# Patient Record
Sex: Female | Born: 1955 | Race: White | Hispanic: No | Marital: Married | Smoking: Never smoker
Health system: Southern US, Community
[De-identification: ages and names within clinical notes are randomized; demographics above are authoritative.]

## PROBLEM LIST (undated history)

## (undated) DIAGNOSIS — R7401 Elevation of levels of liver transaminase levels: Secondary | ICD-10-CM

## (undated) DIAGNOSIS — E119 Type 2 diabetes mellitus without complications: Secondary | ICD-10-CM

## (undated) DIAGNOSIS — R740 Nonspecific elevation of levels of transaminase and lactic acid dehydrogenase [LDH]: Secondary | ICD-10-CM

## (undated) DIAGNOSIS — E785 Hyperlipidemia, unspecified: Secondary | ICD-10-CM

## (undated) DIAGNOSIS — I10 Essential (primary) hypertension: Secondary | ICD-10-CM

## (undated) DIAGNOSIS — R7303 Prediabetes: Secondary | ICD-10-CM

## (undated) HISTORY — DX: Type 2 diabetes mellitus without complications: E11.9

## (undated) HISTORY — DX: Elevation of levels of liver transaminase levels: R74.01

## (undated) HISTORY — DX: Nonspecific elevation of levels of transaminase and lactic acid dehydrogenase (ldh): R74.0

## (undated) HISTORY — DX: Essential (primary) hypertension: I10

## (undated) HISTORY — DX: Prediabetes: R73.03

## (undated) HISTORY — PX: INDUCED ABORTION: SHX677

## (undated) HISTORY — PX: PARTIAL HYSTERECTOMY: SHX80

## (undated) HISTORY — DX: Hyperlipidemia, unspecified: E78.5

## (undated) HISTORY — PX: TUBAL LIGATION: SHX77

---

## 1997-07-18 ENCOUNTER — Ambulatory Visit (HOSPITAL_COMMUNITY): Admission: RE | Admit: 1997-07-18 | Discharge: 1997-07-18 | Payer: Self-pay | Admitting: Obstetrics & Gynecology

## 1998-04-30 ENCOUNTER — Inpatient Hospital Stay (HOSPITAL_COMMUNITY): Admission: RE | Admit: 1998-04-30 | Discharge: 1998-05-02 | Payer: Self-pay | Admitting: Obstetrics & Gynecology

## 2016-10-21 ENCOUNTER — Encounter: Payer: Self-pay | Admitting: Family Medicine

## 2016-10-21 ENCOUNTER — Ambulatory Visit (INDEPENDENT_AMBULATORY_CARE_PROVIDER_SITE_OTHER): Payer: Managed Care, Other (non HMO) | Admitting: Family Medicine

## 2016-10-21 VITALS — BP 120/80 | HR 55 | Ht 66.0 in | Wt 200.2 lb

## 2016-10-21 DIAGNOSIS — Z23 Encounter for immunization: Secondary | ICD-10-CM

## 2016-10-21 DIAGNOSIS — I10 Essential (primary) hypertension: Secondary | ICD-10-CM | POA: Diagnosis not present

## 2016-10-21 DIAGNOSIS — Z1159 Encounter for screening for other viral diseases: Secondary | ICD-10-CM | POA: Diagnosis not present

## 2016-10-21 DIAGNOSIS — Z0000 Encounter for general adult medical examination without abnormal findings: Secondary | ICD-10-CM

## 2016-10-21 DIAGNOSIS — Z114 Encounter for screening for human immunodeficiency virus [HIV]: Secondary | ICD-10-CM

## 2016-10-21 DIAGNOSIS — E785 Hyperlipidemia, unspecified: Secondary | ICD-10-CM | POA: Insufficient documentation

## 2016-10-21 DIAGNOSIS — Z1211 Encounter for screening for malignant neoplasm of colon: Secondary | ICD-10-CM

## 2016-10-21 DIAGNOSIS — E669 Obesity, unspecified: Secondary | ICD-10-CM

## 2016-10-21 LAB — COMPREHENSIVE METABOLIC PANEL
ALT: 20 U/L (ref 6–29)
AST: 17 U/L (ref 10–35)
Albumin: 4.3 g/dL (ref 3.6–5.1)
Alkaline Phosphatase: 42 U/L (ref 33–130)
BUN: 13 mg/dL (ref 7–25)
CO2: 26 mmol/L (ref 20–31)
Calcium: 9.4 mg/dL (ref 8.6–10.4)
Chloride: 105 mmol/L (ref 98–110)
Creat: 0.92 mg/dL (ref 0.50–1.05)
Glucose, Bld: 94 mg/dL (ref 65–99)
Potassium: 3.7 mmol/L (ref 3.5–5.3)
Sodium: 140 mmol/L (ref 135–146)
Total Bilirubin: 0.4 mg/dL (ref 0.2–1.2)
Total Protein: 7.2 g/dL (ref 6.1–8.1)

## 2016-10-21 LAB — CBC WITH DIFFERENTIAL/PLATELET
Basophils Absolute: 57 cells/uL (ref 0–200)
Basophils Relative: 1 %
EOS ABS: 228 {cells}/uL (ref 15–500)
Eosinophils Relative: 4 %
HEMATOCRIT: 40.8 % (ref 35.0–45.0)
HEMOGLOBIN: 13.4 g/dL (ref 11.7–15.5)
LYMPHS ABS: 2622 {cells}/uL (ref 850–3900)
Lymphocytes Relative: 46 %
MCH: 29.7 pg (ref 27.0–33.0)
MCHC: 32.8 g/dL (ref 32.0–36.0)
MCV: 90.5 fL (ref 80.0–100.0)
MONO ABS: 513 {cells}/uL (ref 200–950)
MPV: 9.3 fL (ref 7.5–12.5)
Monocytes Relative: 9 %
NEUTROS ABS: 2280 {cells}/uL (ref 1500–7800)
Neutrophils Relative %: 40 %
Platelets: 254 10*3/uL (ref 140–400)
RBC: 4.51 MIL/uL (ref 3.80–5.10)
RDW: 13.1 % (ref 11.0–15.0)
WBC: 5.7 10*3/uL (ref 4.0–10.5)

## 2016-10-21 LAB — POCT URINALYSIS DIPSTICK
Bilirubin, UA: NEGATIVE
Blood, UA: NEGATIVE
Glucose, UA: NEGATIVE
Ketones, UA: NEGATIVE
Leukocytes, UA: NEGATIVE
Nitrite, UA: NEGATIVE
Protein, UA: NEGATIVE
Spec Grav, UA: 1.015 (ref 1.010–1.025)
Urobilinogen, UA: NEGATIVE E.U./dL — AB
pH, UA: 6 (ref 5.0–8.0)

## 2016-10-21 LAB — LIPID PANEL
Cholesterol: 190 mg/dL (ref <200)
HDL: 63 mg/dL (ref >50)
LDL CALC: 112 mg/dL — AB (ref <100)
TRIGLYCERIDES: 75 mg/dL (ref <150)
Total CHOL/HDL Ratio: 3 Ratio (ref <5.0)
VLDL: 15 mg/dL (ref <30)

## 2016-10-21 LAB — TSH: TSH: 3.03 m[IU]/L

## 2016-10-21 NOTE — Patient Instructions (Signed)
You will be due for your mammogram in September. You can call me and I will order this.  We will call you with lab results.   Preventative Care for Adults - Female      MAINTAIN REGULAR HEALTH EXAMS:  A routine yearly physical is a good way to check in with your primary care provider about your health and preventive screening. It is also an opportunity to share updates about your health and any concerns you have, and receive a thorough all-over exam.   Most health insurance companies pay for at least some preventative services.  Check with your health plan for specific coverages.  WHAT PREVENTATIVE SERVICES DO WOMEN NEED?  Adult women should have their weight and blood pressure checked regularly.   Women age 54 and older should have their cholesterol levels checked regularly.  Women should be screened for cervical cancer with a Pap smear and pelvic exam beginning at either age 52, or 3 years after they become sexually activity.    Breast cancer screening generally begins at age 6 with a mammogram and breast exam by your primary care provider.    Beginning at age 22 and continuing to age 81, women should be screened for colorectal cancer.  Certain people may need continued testing until age 84.  Updating vaccinations is part of preventative care.  Vaccinations help protect against diseases such as the flu.  Osteoporosis is a disease in which the bones lose minerals and strength as we age. Women ages 85 and over should discuss this with their caregivers, as should women after menopause who have other risk factors.  Lab tests are generally done as part of preventative care to screen for anemia and blood disorders, to screen for problems with the kidneys and liver, to screen for bladder problems, to check blood sugar, and to check your cholesterol level.  Preventative services generally include counseling about diet, exercise, avoiding tobacco, drugs, excessive alcohol consumption, and  sexually transmitted infections.    GENERAL RECOMMENDATIONS FOR GOOD HEALTH:  Healthy diet:  Eat a variety of foods, including fruit, vegetables, animal or vegetable protein, such as meat, fish, chicken, and eggs, or beans, lentils, tofu, and grains, such as rice.  Drink plenty of water daily.  Decrease saturated fat in the diet, avoid lots of red meat, processed foods, sweets, fast foods, and fried foods.  Exercise:  Aerobic exercise helps maintain good heart health. At least 30-40 minutes of moderate-intensity exercise is recommended. For example, a brisk walk that increases your heart rate and breathing. This should be done on most days of the week.   Find a type of exercise or a variety of exercises that you enjoy so that it becomes a part of your daily life.  Examples are running, walking, swimming, water aerobics, and biking.  For motivation and support, explore group exercise such as aerobic class, spin class, Zumba, Yoga,or  martial arts, etc.    Set exercise goals for yourself, such as a certain weight goal, walk or run in a race such as a 5k walk/run.  Speak to your primary care provider about exercise goals.  Disease prevention:  If you smoke or chew tobacco, find out from your caregiver how to quit. It can literally save your life, no matter how long you have been a tobacco user. If you do not use tobacco, never begin.   Maintain a healthy diet and normal weight. Increased weight leads to problems with blood pressure and diabetes.   The  Body Mass Index or BMI is a way of measuring how much of your body is fat. Having a BMI above 27 increases the risk of heart disease, diabetes, hypertension, stroke and other problems related to obesity. Your caregiver can help determine your BMI and based on it develop an exercise and dietary program to help you achieve or maintain this important measurement at a healthful level.  High blood pressure causes heart and blood vessel problems.   Persistent high blood pressure should be treated with medicine if weight loss and exercise do not work.   Fat and cholesterol leaves deposits in your arteries that can block them. This causes heart disease and vessel disease elsewhere in your body.  If your cholesterol is found to be high, or if you have heart disease or certain other medical conditions, then you may need to have your cholesterol monitored frequently and be treated with medication.   Ask if you should have a cardiac stress test if your history suggests this. A stress test is a test done on a treadmill that looks for heart disease. This test can find disease prior to there being a problem.  Menopause can be associated with physical symptoms and risks. Hormone replacement therapy is available to decrease these. You should talk to your caregiver about whether starting or continuing to take hormones is right for you.   Osteoporosis is a disease in which the bones lose minerals and strength as we age. This can result in serious bone fractures. Risk of osteoporosis can be identified using a bone density scan. Women ages 59 and over should discuss this with their caregivers, as should women after menopause who have other risk factors. Ask your caregiver whether you should be taking a calcium supplement and Vitamin D, to reduce the rate of osteoporosis.   Avoid drinking alcohol in excess (more than two drinks per day).  Avoid use of street drugs. Do not share needles with anyone. Ask for professional help if you need assistance or instructions on stopping the use of alcohol, cigarettes, and/or drugs.  Brush your teeth twice a day with fluoride toothpaste, and floss once a day. Good oral hygiene prevents tooth decay and gum disease. The problems can be painful, unattractive, and can cause other health problems. Visit your dentist for a routine oral and dental check up and preventive care every 6-12 months.   Look at your skin regularly.  Use a  mirror to look at your back. Notify your caregivers of changes in moles, especially if there are changes in shapes, colors, a size larger than a pencil eraser, an irregular border, or development of new moles.  Safety:  Use seatbelts 100% of the time, whether driving or as a passenger.  Use safety devices such as hearing protection if you work in environments with loud noise or significant background noise.  Use safety glasses when doing any work that could send debris in to the eyes.  Use a helmet if you ride a bike or motorcycle.  Use appropriate safety gear for contact sports.  Talk to your caregiver about gun safety.  Use sunscreen with a SPF (or skin protection factor) of 15 or greater.  Lighter skinned people are at a greater risk of skin cancer. Don't forget to also wear sunglasses in order to protect your eyes from too much damaging sunlight. Damaging sunlight can accelerate cataract formation.   Practice safe sex. Use condoms. Condoms are used for birth control and to help reduce the spread  of sexually transmitted infections (or STIs).  Some of the STIs are gonorrhea (the clap), chlamydia, syphilis, trichomonas, herpes, HPV (human papilloma virus) and HIV (human immunodeficiency virus) which causes AIDS. The herpes, HIV and HPV are viral illnesses that have no cure. These can result in disability, cancer and death.   Keep carbon monoxide and smoke detectors in your home functioning at all times. Change the batteries every 6 months or use a model that plugs into the wall.   Vaccinations:  Stay up to date with your tetanus shots and other required immunizations. You should have a booster for tetanus every 10 years. Be sure to get your flu shot every year, since 5%-20% of the [place] population comes down with the flu. The flu vaccine changes each year, so being vaccinated once is not enough. Get your shot in the fall, before the flu season peaks.   Other vaccines to consider:  Human Papilloma  Virus or HPV causes cancer of the cervix, and other infections that can be transmitted from person to person. There is a vaccine for HPV, and females should get immunized between the ages of 3 and 28. It requires a series of 3 shots.   Pneumococcal vaccine to protect against certain types of pneumonia.  This is normally recommended for adults age 78 or older.  However, adults younger than 60 years old with certain underlying conditions such as diabetes, heart or lung disease should also receive the vaccine.  Shingles vaccine to protect against Varicella Zoster if you are older than age 73, or younger than 60 years old with certain underlying illness.  Hepatitis A vaccine to protect against a form of infection of the liver by a virus acquired from food.  Hepatitis B vaccine to protect against a form of infection of the liver by a virus acquired from blood or body fluids, particularly if you work in health care.  If you plan to travel internationally, check with your local health department for specific vaccination recommendations.  Cancer Screening:  Breast cancer screening is essential to preventive care for women. All women age 48 and older should perform a breast self-exam every month. At age 20 and older, women should have their caregiver complete a breast exam each year. Women at ages 69 and older should have a mammogram (x-ray film) of the breasts. Your caregiver can discuss how often you need mammograms.    Cervical cancer screening includes taking a Pap smear (sample of cells examined under a microscope) from the cervix (end of the uterus). It also includes testing for HPV (Human Papilloma Virus, which can cause cervical cancer). Screening and a pelvic exam should begin at age 73, or 3 years after a woman becomes sexually active. Screening should occur every year, with a Pap smear but no HPV testing, up to age 42. After age 48, you should have a Pap smear every 3 years with HPV testing, if no  HPV was found previously.   Most routine colon cancer screening begins at the age of 77. On a yearly basis, doctors may provide special easy to use take-home tests to check for hidden blood in the stool. Sigmoidoscopy or colonoscopy can detect the earliest forms of colon cancer and is life saving. These tests use a small camera at the end of a tube to directly examine the colon. Speak to your caregiver about this at age 60, when routine screening begins (and is repeated every 5 years unless early forms of pre-cancerous polyps or small  growths are found).

## 2016-10-21 NOTE — Progress Notes (Signed)
Subjective:    Patient ID: Brittany Mooney, female    DOB: [birth year], 61 y.o.   MRN: [id number]  HPI Chief Complaint  Patient presents with  . new pt    new pt, fasting cpe   She is new to the practice and here for a complete physical exam. Previous medical care: [facility] in [place].  Last CPE: 2016   Other providers: none  Past medical history: hypertension- diagnosed 10 years ago.   Hyperlipidemia.   Family history: breast cancer runs in her family.   Social history: Lives with husband, daughter and granddaughter. 2 children with alcoholism. works at [employer], finance area, desk job Denies smoking, drinking alcohol, drug use  Diet: fairly unhealthy  Excerise: nothing particular  Immunizations: Tdap - at least 10 years   Health maintenance:  Mammogram: 2017[facility]  Colonoscopy: never  Last Gynecological Exam: years  Last Menstrual cycle:  N/A Last Dental Exam: over a year ago.  Last Eye Exam: 2-3 years ago   Wears seatbelt always, uses sunscreen, smoke detectors in home and functioning, does not text while driving and feels safe in home environment.   Reviewed allergies, medications, past medical, surgical, family, and social history.   Review of Systems Review of Systems Constitutional: -fever, -chills, -sweats, -unexpected weight change,-fatigue ENT: -runny nose, -ear pain, -sore throat Cardiology:  -chest pain, -palpitations, -edema Respiratory: -cough, -shortness of breath, -wheezing Gastroenterology: -abdominal pain, -nausea, -vomiting, -diarrhea, -constipation  Hematology: -bleeding or bruising problems Musculoskeletal: -arthralgias, -myalgias, -joint swelling, -back pain Ophthalmology: -vision changes Urology: -dysuria, -difficulty urinating, -hematuria, -urinary frequency, -urgency Neurology: -headache, -weakness, -tingling, -numbness       Objective:   Physical Exam BP 120/80   Pulse (!) 55    Ht 5\' 6"  (1.676 m)   Wt 200 lb 3.2 oz (90.8 kg)   BMI 32.31 kg/m   General Appearance:    Alert, cooperative, no distress, appears stated age  Head:    Normocephalic, without obvious abnormality, atraumatic  Eyes:    PERRL, conjunctiva/corneas clear, EOM's intact, fundi    benign  Ears:    Normal TM's and external ear canals  Nose:   Nares normal, mucosa normal, no drainage or sinus   tenderness  Throat:   Lips, mucosa, and tongue normal; teeth and gums normal  Neck:   Supple, no lymphadenopathy;  thyroid:  no   enlargement/tenderness/nodules; no carotid   bruit or JVD  Back:    Spine nontender, no curvature, ROM normal, no CVA     tenderness  Lungs:     Clear to auscultation bilaterally without wheezes, rales or     ronchi; respirations unlabored  Chest Wall:    No tenderness or deformity   Heart:    Regular rate and rhythm, S1 and S2 normal, no murmur, rub   or gallop  Breast Exam:    No tenderness, masses, or nipple discharge or inversion.      No axillary lymphadenopathy  Abdomen:     Soft, non-tender, nondistended, normoactive bowel sounds,    no masses, no hepatosplenomegaly  Genitalia:    Normal external genitalia without lesions.  BUS and vagina normal; cervix without lesions, or cervical motion tenderness. No abnormal vaginal discharge.  Uterus and adnexa not enlarged, nontender, no masses.  Pap performed     Extremities:   No clubbing, cyanosis or edema  Pulses:   2+ and symmetric all extremities  Skin:   Skin color, texture, turgor  normal, no rashes or lesions  Lymph nodes:   Cervical, supraclavicular, and axillary nodes normal  Neurologic:   CNII-XII intact, normal strength, sensation and gait; reflexes 2+ and symmetric throughout          Psych:   Normal mood, affect, hygiene and grooming.    Urinalysis dipstick: neg      Assessment & Plan:  Routine general medical examination at a health care facility - Plan: Urinalysis Dipstick, CBC with Differential/Platelet,  Comprehensive metabolic panel, TSH  Essential hypertension - Plan: CBC with Differential/Platelet, Comprehensive metabolic panel  Hyperlipidemia, unspecified hyperlipidemia type - Plan: Lipid panel  Screen for colon cancer - Plan: Ambulatory referral to Gastroenterology  Need for Tdap vaccination - Plan: Tdap vaccine greater than or equal to 7yo IM  Screening for HIV (human immunodeficiency virus) - Plan: HIV antibody  Need for hepatitis C screening test - Plan: Hepatitis C antibody  Obesity (BMI 30-39.9) - Plan: TSH  Reviewed records from previous PCP.  BP is within goal. Continue on current medication.  Hyperlipidemia- will check fasting lipids and continue current medication.  Counseled on diet and exercise for HTN, hyperlipidemia and obesity.  She has refused colonoscopy in the past and today reports financial issue as a reason that she does not want one. She agrees to go to the initial GI appointment and discuss procedure with GI.  Tdap given.  Hep C and HIV testing done per guidelines.  She will be due for mammogram in September and will call for an order.  Follow up pending labs.

## 2016-10-22 LAB — HIV ANTIBODY (ROUTINE TESTING W REFLEX): HIV: NONREACTIVE

## 2016-10-22 LAB — HEPATITIS C ANTIBODY: HCV Ab: NEGATIVE

## 2016-10-26 ENCOUNTER — Encounter: Payer: Self-pay | Admitting: Family Medicine

## 2016-10-27 ENCOUNTER — Encounter: Payer: Self-pay | Admitting: Internal Medicine

## 2016-11-01 ENCOUNTER — Encounter: Payer: Self-pay | Admitting: Internal Medicine

## 2016-11-09 ENCOUNTER — Telehealth: Payer: Self-pay | Admitting: Family Medicine

## 2016-11-09 DIAGNOSIS — E785 Hyperlipidemia, unspecified: Secondary | ICD-10-CM

## 2016-11-09 NOTE — Telephone Encounter (Signed)
Spoke to patient. She has been on pravastatin for many years once a day. Pt never returned my calls about her lab results so I just went over her cholesterol labs with her and she said to increase her dose. Please send in new higher dose of pravastatin

## 2016-11-09 NOTE — Telephone Encounter (Signed)
Pt needs refill of pravastatin sent to [facility] in [place].

## 2016-11-10 ENCOUNTER — Other Ambulatory Visit: Payer: Self-pay | Admitting: Family Medicine

## 2016-11-10 MED ORDER — PRAVASTATIN SODIUM 20 MG PO TABS: 20.0000 mg | ORAL_TABLET | Freq: Every day | ORAL | 1 refills | Status: DC

## 2016-11-10 NOTE — Telephone Encounter (Signed)
I will send it to her pharmacy. Have her return in 2 months for fasting lipids please.

## 2016-11-10 NOTE — Telephone Encounter (Signed)
Pt was advised that med was sent in to pharmacy and she needed to follow-up in 2 months

## 2016-11-23 ENCOUNTER — Encounter: Payer: Self-pay | Admitting: Family Medicine

## 2016-12-07 ENCOUNTER — Ambulatory Visit (INDEPENDENT_AMBULATORY_CARE_PROVIDER_SITE_OTHER): Payer: Managed Care, Other (non HMO) | Admitting: Family Medicine

## 2016-12-07 ENCOUNTER — Encounter: Payer: Self-pay | Admitting: Family Medicine

## 2016-12-07 VITALS — BP 110/70 | HR 60 | Temp 97.9°F | Wt 196.6 lb

## 2016-12-07 DIAGNOSIS — N898 Other specified noninflammatory disorders of vagina: Secondary | ICD-10-CM

## 2016-12-07 DIAGNOSIS — N760 Acute vaginitis: Secondary | ICD-10-CM

## 2016-12-07 DIAGNOSIS — R350 Frequency of micturition: Secondary | ICD-10-CM | POA: Diagnosis not present

## 2016-12-07 DIAGNOSIS — B9689 Other specified bacterial agents as the cause of diseases classified elsewhere: Secondary | ICD-10-CM | POA: Diagnosis not present

## 2016-12-07 LAB — POCT WET PREP (WET MOUNT)
CLUE CELLS WET PREP WHIFF POC: POSITIVE
KOH WET PREP POC: NEGATIVE
TRICHOMONAS WET PREP HPF POC: ABSENT

## 2016-12-07 LAB — POCT URINALYSIS DIP (PROADVANTAGE DEVICE)
BILIRUBIN UA: NEGATIVE
BILIRUBIN UA: NEGATIVE mg/dL
GLUCOSE UA: NEGATIVE mg/dL
NITRITE UA: NEGATIVE
PH UA: 6.5 (ref 5.0–8.0)
Protein Ur, POC: NEGATIVE mg/dL
SPECIFIC GRAVITY, URINE: 1.025
Urobilinogen, Ur: NEGATIVE

## 2016-12-07 MED ORDER — METRONIDAZOLE 500 MG PO TABS
500.0000 mg | ORAL_TABLET | Freq: Two times a day (BID) | ORAL | 0 refills | Status: DC
Start: 2016-12-07 — End: 2017-08-10

## 2016-12-07 NOTE — Patient Instructions (Signed)
Do not drink alcohol when taking the metronidazole. If your symptoms do not resolve then let me know.    Bacterial Vaginosis Bacterial vaginosis is a vaginal infection that occurs when the normal balance of bacteria in the vagina is disrupted. It results from an overgrowth of certain bacteria. This is the most common vaginal infection among women ages 1-44. Because bacterial vaginosis increases your risk for STIs (sexually transmitted infections), getting treated can help reduce your risk for chlamydia, gonorrhea, herpes, and HIV (human immunodeficiency virus). Treatment is also important for preventing complications in pregnant women, because this condition can cause an early (premature) delivery. What are the causes? This condition is caused by an increase in harmful bacteria that are normally present in small amounts in the vagina. However, the reason that the condition develops is not fully understood. What increases the risk? The following factors may make you more likely to develop this condition:  Having a new sexual partner or multiple sexual partners.  Having unprotected sex.  Douching.  Having an intrauterine device (IUD).  Smoking.  Drug and alcohol abuse.  Taking certain antibiotic medicines.  Being pregnant.  You cannot get bacterial vaginosis from toilet seats, bedding, swimming pools, or contact with objects around you. What are the signs or symptoms? Symptoms of this condition include:  Grey or white vaginal discharge. The discharge can also be watery or foamy.  A fish-like odor with discharge, especially after sexual intercourse or during menstruation.  Itching in and around the vagina.  Burning or pain with urination.  Some women with bacterial vaginosis have no signs or symptoms. How is this diagnosed? This condition is diagnosed based on:  Your medical history.  A physical exam of the vagina.  Testing a sample of vaginal fluid under a microscope to  look for a large amount of bad bacteria or abnormal cells. Your health care provider may use a cotton swab or a small wooden spatula to collect the sample.  How is this treated? This condition is treated with antibiotics. These may be given as a pill, a vaginal cream, or a medicine that is put into the vagina (suppository). If the condition comes back after treatment, a second round of antibiotics may be needed. Follow these instructions at home: Medicines  Take over-the-counter and prescription medicines only as told by your health care provider.  Take or use your antibiotic as told by your health care provider. Do not stop taking or using the antibiotic even if you start to feel better. General instructions  If you have a female sexual partner, tell her that you have a vaginal infection. She should see her health care provider and be treated if she has symptoms. If you have a female sexual partner, he does not need treatment.  During treatment: ? Avoid sexual activity until you finish treatment. ? Do not douche. ? Avoid alcohol as directed by your health care provider. ? Avoid breastfeeding as directed by your health care provider.  Drink enough water and fluids to keep your urine clear or pale yellow.  Keep the area around your vagina and rectum clean. ? Wash the area daily with warm water. ? Wipe yourself from front to back after using the toilet.  Keep all follow-up visits as told by your health care provider. This is important. How is this prevented?  Do not douche.  Wash the outside of your vagina with warm water only.  Use protection when having sex. This includes latex condoms and dental dams.  Limit how many sexual partners you have. To help prevent bacterial vaginosis, it is best to have sex with just one partner (monogamous).  Make sure you and your sexual partner are tested for STIs.  Wear cotton or cotton-lined underwear.  Avoid wearing tight pants and pantyhose,  especially during summer.  Limit the amount of alcohol that you drink.  Do not use any products that contain nicotine or tobacco, such as cigarettes and e-cigarettes. If you need help quitting, ask your health care provider.  Do not use illegal drugs. Where to find more information:  Centers for Disease Control and Prevention: AppraiserFraud.fi  American Sexual Health Association (ASHA): www.ashastd.org  [place] Department of Health and Financial controller, Office on Women's Health: DustingSprays.pl or SecuritiesCard.it Contact a health care provider if:  Your symptoms do not improve, even after treatment.  You have more discharge or pain when urinating.  You have a fever.  You have pain in your abdomen.  You have pain during sex.  You have vaginal bleeding between periods. Summary  Bacterial vaginosis is a vaginal infection that occurs when the normal balance of bacteria in the vagina is disrupted.  Because bacterial vaginosis increases your risk for STIs (sexually transmitted infections), getting treated can help reduce your risk for chlamydia, gonorrhea, herpes, and HIV (human immunodeficiency virus). Treatment is also important for preventing complications in pregnant women, because the condition can cause an early (premature) delivery.  This condition is treated with antibiotic medicines. These may be given as a pill, a vaginal cream, or a medicine that is put into the vagina (suppository). This information is not intended to replace advice given to you by your health care provider. Make sure you discuss any questions you have with your health care provider. Document Released: 05/31/2005 Document Revised: 02/14/2016 Document Reviewed: 02/14/2016 Elsevier Interactive Patient Education  2017 [employer].

## 2016-12-07 NOTE — Progress Notes (Signed)
   Subjective:    Patient ID: Brittany Mooney, female    DOB: Nov 06, 1955, 61 y.o.   MRN: [id number]  HPI Chief Complaint  Patient presents with  . vaginal discharge    vaginal discharge, since friday. no odor, no itchy   She is here with complaints of yellowish vaginal discharge for the past 5 days. States she has had to wear a pad due to discharge. No blood, itching or odor.  Also complains of urinary frequency, no urgency or pain. No leakage.   Denies new soap, lotion or medications. Denies being in hot tubs, pools or sitting in a wet bathing suit. Denies sex toy use.  No fever, chills, abdominal pain, back pain.   STI history: denies  Last sexual encounter: 6 months ago.  Contraception: N/A Last pap smear: hysterectomy but states still has ovaries.   Reviewed allergies, medications, past medical, surgical, and social history.   Review of Systems Pertinent positives and negatives in the history of present illness.     Objective:   Physical Exam BP 110/70   Pulse 60   Temp 97.9 F (36.6 C) (Oral)   Wt 196 lb 9.6 oz (89.2 kg)   BMI 31.73 kg/m   Alert and in no distress. Abdominal exam normal. No CVA tenderness. Pelvic exam reveals white-yellowish malodorous discharge in the vaginal vault, no blood or lesions. Non tender on exam. No adnexal fullness or tenderness.      Assessment & Plan:  Vaginal discharge - Plan: POCT Wet Prep [facility]), GC/Chlamydia Probe Amp  Urinary frequency - Plan: POCT Urinalysis DIP (Proadvantage Device)  BV (bacterial vaginosis)  Wet prep +BV, -yeast, -trich Will check for GC/CT Counseled on diagnosis and treatment of BV. metronidazole prescribed. Advised to avoid alcohol while taking the antibiotic.  UA 2+ leu, trace blood Follow up if symptoms do not resolve with treatment.

## 2016-12-08 ENCOUNTER — Telehealth: Payer: Self-pay

## 2016-12-08 LAB — GC/CHLAMYDIA PROBE AMP
CT PROBE, AMP APTIMA: NOT DETECTED
GC PROBE AMP APTIMA: NOT DETECTED

## 2016-12-08 MED ORDER — ATENOLOL 50 MG PO TABS: 50.0000 mg | ORAL_TABLET | Freq: Every day | ORAL | 5 refills | Status: DC

## 2016-12-08 NOTE — Telephone Encounter (Signed)
Fax request rcvd from [facility] in [place] for atenolol. Brittany Mooney

## 2016-12-08 NOTE — Telephone Encounter (Signed)
done

## 2017-01-04 ENCOUNTER — Telehealth: Payer: Self-pay | Admitting: Family Medicine

## 2017-01-04 MED ORDER — HYDROCHLOROTHIAZIDE 25 MG PO TABS: 25.0000 mg | ORAL_TABLET | Freq: Every day | ORAL | 5 refills | Status: DC

## 2017-01-04 NOTE — Telephone Encounter (Signed)
Sent to pharmacy 

## 2017-01-04 NOTE — Telephone Encounter (Signed)
Pt called requesting a refill on her hydrochlorothiazide 25 mg pt would like it sent to the [facility], [place] she can be reached at [phone]

## 2017-03-08 ENCOUNTER — Telehealth: Payer: Self-pay | Admitting: Family Medicine

## 2017-03-08 MED ORDER — ATENOLOL 50 MG PO TABS: 50.0000 mg | ORAL_TABLET | Freq: Every day | ORAL | 0 refills | Status: DC

## 2017-03-08 NOTE — Telephone Encounter (Signed)
Refilled med for #90

## 2017-03-08 NOTE — Telephone Encounter (Signed)
[facility] req Atenolol 50 mg   #90 tab

## 2017-03-22 ENCOUNTER — Other Ambulatory Visit (INDEPENDENT_AMBULATORY_CARE_PROVIDER_SITE_OTHER): Payer: Managed Care, Other (non HMO)

## 2017-03-22 DIAGNOSIS — Z23 Encounter for immunization: Secondary | ICD-10-CM

## 2017-05-09 ENCOUNTER — Other Ambulatory Visit: Payer: Self-pay | Admitting: Family Medicine

## 2017-06-14 ENCOUNTER — Other Ambulatory Visit: Payer: Self-pay | Admitting: Family Medicine

## 2017-07-11 ENCOUNTER — Other Ambulatory Visit: Payer: Self-pay | Admitting: Family Medicine

## 2017-08-10 ENCOUNTER — Ambulatory Visit: Payer: Managed Care, Other (non HMO) | Admitting: Medical

## 2017-08-10 ENCOUNTER — Encounter: Payer: Self-pay | Admitting: Medical

## 2017-08-10 VITALS — BP 114/70 | HR 77 | Temp 98.5°F | Wt 201.6 lb

## 2017-08-10 DIAGNOSIS — H9202 Otalgia, left ear: Secondary | ICD-10-CM | POA: Diagnosis not present

## 2017-08-10 DIAGNOSIS — J988 Other specified respiratory disorders: Secondary | ICD-10-CM

## 2017-08-10 MED ORDER — AMOXICILLIN 875 MG PO TABS: 875.0000 mg | ORAL_TABLET | Freq: Two times a day (BID) | ORAL | 0 refills | Status: DC

## 2017-08-10 NOTE — Progress Notes (Signed)
Subjective:  Brittany Mooney is a 62 y.o. female who presents for illness.  Started about 4-5 days ago with cough and chest congestion, worsening, and in last 2 days bad left ear pain, fatigue.   Denies fever, sore throat, no NVD, no wheezing, no SOB.   Using Delsym for symptoms.  reports sick contacts, granddaughter 60yo who lives with her on antibiotic this week.  Patient is not a smoker. No other aggravating or relieving factors.  No other c/o.  Past Medical History:  Diagnosis Date  . Hyperlipidemia   . Hypertension    Current Outpatient Medications on File Prior to Visit  Medication Sig Dispense Refill  . atenolol (TENORMIN) 50 MG tablet TAKE 1 TABLET BY MOUTH ONCE DAILY 90 tablet 0  . hydrochlorothiazide (HYDRODIURIL) 25 MG tablet TAKE 1 TABLET BY MOUTH ONCE DAILY 90 tablet 0  . pravastatin (PRAVACHOL) 20 MG tablet TAKE 1 TABLET BY MOUTH ONCE DAILY 90 tablet 1   No current facility-administered medications on file prior to visit.     ROS as in subjective   Objective: BP 114/70   Pulse 77   Temp 98.5 F (36.9 C)   Wt 201 lb 9.6 oz (91.4 kg)   SpO2 96%   BMI 32.54 kg/m   General appearance: Alert, WD/WN, no distress                             Skin: warm, no rash                           Head: no sinus tenderness,                            Eyes: conjunctiva normal, corneas clear, PERRLA                            Ears: erythema of left TM, right TM normal, external ear canals normal                          Nose: septum midline, turbinates swollen, with erythema and clear discharge             Mouth/throat: MMM, tongue normal, mild pharyngeal erythema                           Neck: supple, no adenopathy, no thyromegaly, non tender                         Lungs: CTA bilaterally, no wheezes, rales, or rhonchi       Assessment  Encounter Diagnoses  Name Primary?  Marland Kitchen Respiratory tract infection Yes  . Acute otalgia, left       Plan: Discussed symptoms and  exam findings.   C/t delsym, can use Mucinex OTC, rest, hydrate well, and begin course of Amoxicillin.  Discussed usual time frame to see improvements.    Brittany Mooney was seen today for coughing, left ear pain.  Diagnoses and all orders for this visit:  Respiratory tract infection  Acute otalgia, left  Other orders -     amoxicillin (AMOXIL) 875 MG tablet; Take 1 tablet (875 mg total) by mouth 2 (two) times daily.   Patient was advised to  call or return if worse or not improving in the next few days.    Patient voiced understanding of diagnosis, recommendations, and treatment plan.

## 2017-09-13 ENCOUNTER — Other Ambulatory Visit: Payer: Self-pay | Admitting: Family Medicine

## 2017-09-13 NOTE — Telephone Encounter (Signed)
Pt scheduled an appt for may

## 2017-10-24 ENCOUNTER — Telehealth: Payer: Self-pay | Admitting: Family Medicine

## 2017-10-24 ENCOUNTER — Encounter: Payer: Self-pay | Admitting: Family Medicine

## 2017-10-24 ENCOUNTER — Ambulatory Visit (INDEPENDENT_AMBULATORY_CARE_PROVIDER_SITE_OTHER): Payer: Managed Care, Other (non HMO) | Admitting: Family Medicine

## 2017-10-24 VITALS — BP 110/80 | HR 56 | Ht 65.0 in | Wt 203.0 lb

## 2017-10-24 DIAGNOSIS — E669 Obesity, unspecified: Secondary | ICD-10-CM | POA: Diagnosis not present

## 2017-10-24 DIAGNOSIS — Z1231 Encounter for screening mammogram for malignant neoplasm of breast: Secondary | ICD-10-CM | POA: Diagnosis not present

## 2017-10-24 DIAGNOSIS — Z1211 Encounter for screening for malignant neoplasm of colon: Secondary | ICD-10-CM | POA: Diagnosis not present

## 2017-10-24 DIAGNOSIS — I10 Essential (primary) hypertension: Secondary | ICD-10-CM | POA: Diagnosis not present

## 2017-10-24 DIAGNOSIS — Z0000 Encounter for general adult medical examination without abnormal findings: Secondary | ICD-10-CM | POA: Diagnosis not present

## 2017-10-24 DIAGNOSIS — E2839 Other primary ovarian failure: Secondary | ICD-10-CM | POA: Diagnosis not present

## 2017-10-24 DIAGNOSIS — E785 Hyperlipidemia, unspecified: Secondary | ICD-10-CM

## 2017-10-24 DIAGNOSIS — Z1239 Encounter for other screening for malignant neoplasm of breast: Secondary | ICD-10-CM

## 2017-10-24 LAB — POCT URINALYSIS DIP (PROADVANTAGE DEVICE)
BILIRUBIN UA: NEGATIVE
BILIRUBIN UA: NEGATIVE mg/dL
Blood, UA: NEGATIVE
GLUCOSE UA: NEGATIVE mg/dL
LEUKOCYTES UA: NEGATIVE
NITRITE UA: NEGATIVE
Protein Ur, POC: NEGATIVE mg/dL
Specific Gravity, Urine: 1.025
Urobilinogen, Ur: NEGATIVE
pH, UA: 6 (ref 5.0–8.0)

## 2017-10-24 NOTE — Telephone Encounter (Signed)
[facility] called and advised patient does not want the Bone Density Scan.

## 2017-10-24 NOTE — Patient Instructions (Addendum)
Call and schedule your mammogram and bone density.   Check with your insurance and if you decide to get the shingles vaccine, Shingrix, then call and let us know.   You will receive a call from [facility] to schedule.   We will call you with your lab results.   Preventative Care for Adults - Female      MAINTAIN REGULAR HEALTH EXAMS:  A routine yearly physical is a good way to check in with your primary care provider about your health and preventive screening. It is also an opportunity to share updates about your health and any concerns you have, and receive a thorough all-over exam.   Most health insurance companies pay for at least some preventative services.  Check with your health plan for specific coverages.  WHAT PREVENTATIVE SERVICES DO WOMEN NEED?  Adult women should have their weight and blood pressure checked regularly.   Women age 90 and older should have their cholesterol levels checked regularly.  Women should be screened for cervical cancer with a Pap smear and pelvic exam beginning at either age 51, or 3 years after they become sexually activity.    Breast cancer screening generally begins at age 75 with a mammogram and breast exam by your primary care provider.    Beginning at age 72 and continuing to age 75, women should be screened for colorectal cancer.  Certain people may need continued testing until age 21.  Updating vaccinations is part of preventative care.  Vaccinations help protect against diseases such as the flu.  Osteoporosis is a disease in which the bones lose minerals and strength as we age. Women ages 41 and over should discuss this with their caregivers, as should women after menopause who have other risk factors.  Lab tests are generally done as part of preventative care to screen for anemia and blood disorders, to screen for problems with the kidneys and liver, to screen for bladder problems, to check blood sugar, and to check your cholesterol  level.  Preventative services generally include counseling about diet, exercise, avoiding tobacco, drugs, excessive alcohol consumption, and sexually transmitted infections.    GENERAL RECOMMENDATIONS FOR GOOD HEALTH:  Healthy diet:  Eat a variety of foods, including fruit, vegetables, animal or vegetable protein, such as meat, fish, chicken, and eggs, or beans, lentils, tofu, and grains, such as rice.  Drink plenty of water daily.  Decrease saturated fat in the diet, avoid lots of red meat, processed foods, sweets, fast foods, and fried foods.  Exercise:  Aerobic exercise helps maintain good heart health. At least 30-40 minutes of moderate-intensity exercise is recommended. For example, a brisk walk that increases your heart rate and breathing. This should be done on most days of the week.   Find a type of exercise or a variety of exercises that you enjoy so that it becomes a part of your daily life.  Examples are running, walking, swimming, water aerobics, and biking.  For motivation and support, explore group exercise such as aerobic class, spin class, Zumba, Yoga,or  martial arts, etc.    Set exercise goals for yourself, such as a certain weight goal, walk or run in a race such as a 5k walk/run.  Speak to your primary care provider about exercise goals.  Disease prevention:  If you smoke or chew tobacco, find out from your caregiver how to quit. It can literally save your life, no matter how long you have been a tobacco user. If you do not  use tobacco, never begin.   Maintain a healthy diet and normal weight. Increased weight leads to problems with blood pressure and diabetes.   The Body Mass Index or BMI is a way of measuring how much of your body is fat. Having a BMI above 27 increases the risk of heart disease, diabetes, hypertension, stroke and other problems related to obesity. Your caregiver can help determine your BMI and based on it develop an exercise and dietary program to  help you achieve or maintain this important measurement at a healthful level.  High blood pressure causes heart and blood vessel problems.  Persistent high blood pressure should be treated with medicine if weight loss and exercise do not work.   Fat and cholesterol leaves deposits in your arteries that can block them. This causes heart disease and vessel disease elsewhere in your body.  If your cholesterol is found to be high, or if you have heart disease or certain other medical conditions, then you may need to have your cholesterol monitored frequently and be treated with medication.   Ask if you should have a cardiac stress test if your history suggests this. A stress test is a test done on a treadmill that looks for heart disease. This test can find disease prior to there being a problem.  Menopause can be associated with physical symptoms and risks. Hormone replacement therapy is available to decrease these. You should talk to your caregiver about whether starting or continuing to take hormones is right for you.   Osteoporosis is a disease in which the bones lose minerals and strength as we age. This can result in serious bone fractures. Risk of osteoporosis can be identified using a bone density scan. Women ages 33 and over should discuss this with their caregivers, as should women after menopause who have other risk factors. Ask your caregiver whether you should be taking a calcium supplement and Vitamin D, to reduce the rate of osteoporosis.   Avoid drinking alcohol in excess (more than two drinks per day).  Avoid use of street drugs. Do not share needles with anyone. Ask for professional help if you need assistance or instructions on stopping the use of alcohol, cigarettes, and/or drugs.  Brush your teeth twice a day with fluoride toothpaste, and floss once a day. Good oral hygiene prevents tooth decay and gum disease. The problems can be painful, unattractive, and can cause other health  problems. Visit your dentist for a routine oral and dental check up and preventive care every 6-12 months.   Look at your skin regularly.  Use a mirror to look at your back. Notify your caregivers of changes in moles, especially if there are changes in shapes, colors, a size larger than a pencil eraser, an irregular border, or development of new moles.  Safety:  Use seatbelts 100% of the time, whether driving or as a passenger.  Use safety devices such as hearing protection if you work in environments with loud noise or significant background noise.  Use safety glasses when doing any work that could send debris in to the eyes.  Use a helmet if you ride a bike or motorcycle.  Use appropriate safety gear for contact sports.  Talk to your caregiver about gun safety.  Use sunscreen with a SPF (or skin protection factor) of 15 or greater.  Lighter skinned people are at a greater risk of skin cancer. Don't forget to also wear sunglasses in order to protect your eyes from too much damaging  sunlight. Damaging sunlight can accelerate cataract formation.   Practice safe sex. Use condoms. Condoms are used for birth control and to help reduce the spread of sexually transmitted infections (or STIs).  Some of the STIs are gonorrhea (the clap), chlamydia, syphilis, trichomonas, herpes, HPV (human papilloma virus) and HIV (human immunodeficiency virus) which causes AIDS. The herpes, HIV and HPV are viral illnesses that have no cure. These can result in disability, cancer and death.   Keep carbon monoxide and smoke detectors in your home functioning at all times. Change the batteries every 6 months or use a model that plugs into the wall.   Vaccinations:  Stay up to date with your tetanus shots and other required immunizations. You should have a booster for tetanus every 10 years. Be sure to get your flu shot every year, since 5%-20% of the [place] population comes down with the flu. The flu vaccine changes each year,  so being vaccinated once is not enough. Get your shot in the fall, before the flu season peaks.   Other vaccines to consider:  Human Papilloma Virus or HPV causes cancer of the cervix, and other infections that can be transmitted from person to person. There is a vaccine for HPV, and females should get immunized between the ages of 51 and 62. It requires a series of 3 shots.   Pneumococcal vaccine to protect against certain types of pneumonia.  This is normally recommended for adults age 45 or older.  However, adults younger than 61 years old with certain underlying conditions such as diabetes, heart or lung disease should also receive the vaccine.  Shingles vaccine to protect against Varicella Zoster if you are older than age 52, or younger than 61 years old with certain underlying illness.  Hepatitis A vaccine to protect against a form of infection of the liver by a virus acquired from food.  Hepatitis B vaccine to protect against a form of infection of the liver by a virus acquired from blood or body fluids, particularly if you work in health care.  If you plan to travel internationally, check with your local health department for specific vaccination recommendations.  Cancer Screening:  Breast cancer screening is essential to preventive care for women. All women age 20 and older should perform a breast self-exam every month. At age 37 and older, women should have their caregiver complete a breast exam each year. Women at ages 34 and older should have a mammogram (x-ray film) of the breasts. Your caregiver can discuss how often you need mammograms.    Cervical cancer screening includes taking a Pap smear (sample of cells examined under a microscope) from the cervix (end of the uterus). It also includes testing for HPV (Human Papilloma Virus, which can cause cervical cancer). Screening and a pelvic exam should begin at age 51, or 3 years after a woman becomes sexually active. Screening should  occur every year, with a Pap smear but no HPV testing, up to age 83. After age 22, you should have a Pap smear every 3 years with HPV testing, if no HPV was found previously.   Most routine colon cancer screening begins at the age of 64. On a yearly basis, doctors may provide special easy to use take-home tests to check for hidden blood in the stool. Sigmoidoscopy or colonoscopy can detect the earliest forms of colon cancer and is life saving. These tests use a small camera at the end of a tube to directly examine the colon. Speak  to your caregiver about this at age 10, when routine screening begins (and is repeated every 5 years unless early forms of pre-cancerous polyps or small growths are found).

## 2017-10-24 NOTE — Progress Notes (Signed)
Subjective:    Patient ID: Brittany Mooney, female    DOB: [birth year], 62 y.o.   MRN: [id number]  HPI Chief Complaint  Patient presents with  . fasting cpe    fasting cpe, no concerns. eye exa, done within a year   She is here for a complete physical exam. Last CPE: 10/2016  Other providers: dermatologist in past  Social history: Lives with her husband and granddaughter  Denies smoking, drinking alcohol, drug use  Diet: fairly healthy  Excerise: walks occasionally   Immunizations: up to date on Tdap.   Health maintenance:  Mammogram: 2017  Colonoscopy: never  Last Gynecological Exam: last year Last Menstrual cycle: hysterectomy at age 71 fibroids. Menopause in her early 51s.    Wears seatbelt always, uses sunscreen, smoke detectors in home and functioning, does not text while driving and feels safe in home environment.   Reviewed allergies, medications, past medical, surgical, family, and social history.     Review of Systems Review of Systems Constitutional: -fever, -chills, -sweats, -unexpected weight change,-fatigue ENT: -runny nose, -ear pain, -sore throat Cardiology:  -chest pain, -palpitations, -edema Respiratory: -cough, -shortness of breath, -wheezing Gastroenterology: -abdominal pain, -nausea, -vomiting, -diarrhea, -constipation  Hematology: -bleeding or bruising problems Musculoskeletal: -arthralgias, -myalgias, -joint swelling, -back pain Ophthalmology: -vision changes Urology: -dysuria, -difficulty urinating, -hematuria, -urinary frequency, -urgency Neurology: -headache, -weakness, -tingling, -numbness       Objective:   Physical Exam BP 110/80   Pulse (!) 56   Ht 5\' 5"  (1.651 m)   Wt 203 lb (92.1 kg)   BMI 33.78 kg/m   General Appearance:    Alert, cooperative, no distress, appears stated age  Head:    Normocephalic, without obvious abnormality, atraumatic  Eyes:    PERRL, conjunctiva/corneas clear, EOM's intact, fundi    benign    Ears:    Normal TM's and external ear canals  Nose:   Nares normal, mucosa normal, no drainage or sinus   tenderness  Throat:   Lips, mucosa, and tongue normal; teeth and gums normal  Neck:   Supple, no lymphadenopathy;  thyroid:  no   enlargement/tenderness/nodules; no carotid   bruit or JVD  Back:    Spine nontender, no curvature, ROM normal, no CVA     tenderness  Lungs:     Clear to auscultation bilaterally without wheezes, rales or     ronchi; respirations unlabored  Chest Wall:    No tenderness or deformity   Heart:    Regular rate and rhythm, S1 and S2 normal, no murmur, rub   or gallop  Breast Exam:    Declines, mammogram ordered  Abdomen:     Soft, non-tender, nondistended, normoactive bowel sounds,    no masses, no hepatosplenomegaly  Genitalia:    Declines. Pap not indicated due to hysterectomy   Rectal:    Colonoscopy   Extremities:   No clubbing, cyanosis or edema  Pulses:   2+ and symmetric all extremities  Skin:   Skin color, texture, turgor normal, no rashes or lesions  Lymph nodes:   Cervical, supraclavicular, and axillary nodes normal  Neurologic:   CNII-XII intact, normal strength, sensation and gait; reflexes 2+ and symmetric throughout          Psych:   Normal mood, affect, hygiene and grooming.    Urinalysis dipstick: negative       Assessment & Plan:  Routine general medical examination at a health care facility - Plan: POCT Urinalysis DIP (Proadvantage Device),  CBC with Differential/Platelet, Comprehensive metabolic panel, TSH  Essential hypertension - Plan: CBC with Differential/Platelet, Comprehensive metabolic panel  Hyperlipidemia, unspecified hyperlipidemia type - Plan: Lipid panel  Screen for colon cancer - Plan: Ambulatory referral to Gastroenterology  Screening for breast cancer - Plan: MM 3D SCREEN BREAST BILATERAL, CANCELED: MM DIGITAL SCREENING BILATERAL  Estrogen deficiency - Plan: DG Bone Density  Obesity (BMI 30-39.9) - Plan: TSH, Lipid  panel  She appears to be doing well overall.  Blood pressures well controlled on current medications no side effects.  She will continue her medication for now.  She is on a statin and reports good compliance. Mammogram ordered.  She is never had a bone density and will check and see if this is covered with her insurance Referral to GI for her first screening colonoscopy.  Mother recently diagnosed with colon cancer. Discussed safety and health maintenance. She will check with her insurance regarding shingles vaccine and let us know if she decides to get this. Follow-up pending labs Counseling done on healthy diet and exercise.

## 2017-10-25 LAB — LIPID PANEL
CHOLESTEROL TOTAL: 180 mg/dL (ref 100–199)
Chol/HDL Ratio: 3 ratio (ref 0.0–4.4)
HDL: 60 mg/dL (ref >39)
LDL Calculated: 99 mg/dL (ref 0–99)
TRIGLYCERIDES: 106 mg/dL (ref 0–149)
VLDL CHOLESTEROL CAL: 21 mg/dL (ref 5–40)

## 2017-10-25 LAB — CBC WITH DIFFERENTIAL/PLATELET
BASOS: 1 %
Basophils Absolute: 0 10*3/uL (ref 0.0–0.2)
EOS (ABSOLUTE): 0.2 10*3/uL (ref 0.0–0.4)
EOS: 4 %
HEMATOCRIT: 41.9 % (ref 34.0–46.6)
Hemoglobin: 14.1 g/dL (ref 11.1–15.9)
IMMATURE GRANULOCYTES: 0 %
Immature Grans (Abs): 0 10*3/uL (ref 0.0–0.1)
LYMPHS ABS: 2 10*3/uL (ref 0.7–3.1)
Lymphs: 36 %
MCH: 30.4 pg (ref 26.6–33.0)
MCHC: 33.7 g/dL (ref 31.5–35.7)
MCV: 90 fL (ref 79–97)
MONOS ABS: 0.5 10*3/uL (ref 0.1–0.9)
Monocytes: 9 %
Neutrophils Absolute: 2.7 10*3/uL (ref 1.4–7.0)
Neutrophils: 50 %
Platelets: 267 10*3/uL (ref 150–379)
RBC: 4.64 x10E6/uL (ref 3.77–5.28)
RDW: 13.7 % (ref 12.3–15.4)
WBC: 5.4 10*3/uL (ref 3.4–10.8)

## 2017-10-25 LAB — COMPREHENSIVE METABOLIC PANEL
ALBUMIN: 4.4 g/dL (ref 3.6–4.8)
ALT: 36 IU/L — ABNORMAL HIGH (ref 0–32)
AST: 23 IU/L (ref 0–40)
Albumin/Globulin Ratio: 1.5 (ref 1.2–2.2)
Alkaline Phosphatase: 52 IU/L (ref 39–117)
BUN/Creatinine Ratio: 20 (ref 12–28)
BUN: 15 mg/dL (ref 8–27)
Bilirubin Total: 0.4 mg/dL (ref 0.0–1.2)
CALCIUM: 9.8 mg/dL (ref 8.7–10.3)
CO2: 24 mmol/L (ref 20–29)
CREATININE: 0.76 mg/dL (ref 0.57–1.00)
Chloride: 104 mmol/L (ref 96–106)
GFR calc Af Amer: 99 mL/min/{1.73_m2} (ref >59)
GFR calc non Af Amer: 86 mL/min/{1.73_m2} (ref >59)
GLOBULIN, TOTAL: 3 g/dL (ref 1.5–4.5)
Glucose: 99 mg/dL (ref 65–99)
Potassium: 4.4 mmol/L (ref 3.5–5.2)
Sodium: 142 mmol/L (ref 134–144)
Total Protein: 7.4 g/dL (ref 6.0–8.5)

## 2017-10-25 LAB — TSH: TSH: 2.15 u[IU]/mL (ref 0.450–4.500)

## 2017-10-26 ENCOUNTER — Encounter: Payer: Self-pay | Admitting: Internal Medicine

## 2017-10-26 ENCOUNTER — Other Ambulatory Visit: Payer: Self-pay | Admitting: Family Medicine

## 2017-10-26 MED ORDER — HYDROCHLOROTHIAZIDE 25 MG PO TABS: 25.0000 mg | ORAL_TABLET | Freq: Every day | ORAL | 0 refills | Status: DC

## 2017-10-27 ENCOUNTER — Other Ambulatory Visit: Payer: Self-pay | Admitting: Family Medicine

## 2017-10-27 DIAGNOSIS — R748 Abnormal levels of other serum enzymes: Secondary | ICD-10-CM

## 2017-10-27 LAB — HEPATITIS PANEL, ACUTE
HEP A IGM: NEGATIVE
HEP B S AG: NEGATIVE
Hep B C IgM: NEGATIVE

## 2017-10-27 LAB — SPECIMEN STATUS REPORT

## 2017-11-15 ENCOUNTER — Ambulatory Visit: Payer: Self-pay

## 2017-11-15 ENCOUNTER — Ambulatory Visit
Admission: RE | Admit: 2017-11-15 | Discharge: 2017-11-15 | Disposition: A | Discharge status: Home / Self Care | Payer: Managed Care, Other (non HMO) | Source: Ambulatory Visit | Attending: Family Medicine | Admitting: Family Medicine

## 2017-11-15 DIAGNOSIS — Z1239 Encounter for other screening for malignant neoplasm of breast: Secondary | ICD-10-CM

## 2017-11-20 ENCOUNTER — Other Ambulatory Visit: Payer: Self-pay | Admitting: Family Medicine

## 2017-11-24 ENCOUNTER — Encounter: Payer: Self-pay | Admitting: Family Medicine

## 2017-12-20 ENCOUNTER — Other Ambulatory Visit: Payer: Managed Care, Other (non HMO)

## 2017-12-20 DIAGNOSIS — R748 Abnormal levels of other serum enzymes: Secondary | ICD-10-CM

## 2017-12-21 LAB — HEPATIC FUNCTION PANEL
ALBUMIN: 4.4 g/dL (ref 3.6–4.8)
ALT: 33 IU/L — ABNORMAL HIGH (ref 0–32)
AST: 24 IU/L (ref 0–40)
Alkaline Phosphatase: 51 IU/L (ref 39–117)
Bilirubin Total: 0.3 mg/dL (ref 0.0–1.2)
Bilirubin, Direct: 0.08 mg/dL (ref 0.00–0.40)
TOTAL PROTEIN: 7.2 g/dL (ref 6.0–8.5)

## 2018-01-25 ENCOUNTER — Other Ambulatory Visit: Payer: Self-pay | Admitting: Family Medicine

## 2018-03-21 ENCOUNTER — Other Ambulatory Visit (INDEPENDENT_AMBULATORY_CARE_PROVIDER_SITE_OTHER): Payer: Managed Care, Other (non HMO)

## 2018-03-21 DIAGNOSIS — Z23 Encounter for immunization: Secondary | ICD-10-CM | POA: Diagnosis not present

## 2018-05-17 ENCOUNTER — Encounter: Payer: Self-pay | Admitting: Family Medicine

## 2018-05-17 ENCOUNTER — Ambulatory Visit: Payer: Managed Care, Other (non HMO) | Admitting: Family Medicine

## 2018-05-17 VITALS — BP 120/70 | HR 66 | Temp 97.5°F | Resp 16 | Wt 207.8 lb

## 2018-05-17 DIAGNOSIS — J029 Acute pharyngitis, unspecified: Secondary | ICD-10-CM | POA: Diagnosis not present

## 2018-05-17 DIAGNOSIS — R058 Other specified cough: Secondary | ICD-10-CM

## 2018-05-17 DIAGNOSIS — R05 Cough: Secondary | ICD-10-CM

## 2018-05-17 MED ORDER — BENZONATATE 200 MG PO CAPS: 200.0000 mg | ORAL_CAPSULE | Freq: Two times a day (BID) | ORAL | 0 refills | Status: DC | PRN

## 2018-05-17 MED ORDER — AZITHROMYCIN 250 MG PO TABS: ORAL_TABLET | ORAL | 0 refills | Status: DC

## 2018-05-17 NOTE — Progress Notes (Signed)
Chief Complaint  Patient presents with  . cough    cough, sore throat, congestion, drainage, ear pain- going on for 2 weeks    Subjective:  Brittany Mooney is a 62 y.o. female who presents for a 2 week history of sore throat, cough, left ear pain that seemed to be improving until a couple of days ago when her symptoms became acutely worse. Cough was productive last night and kept her awake.   Denies fever, chills, ear pain, rhinorrhea, nasal congestion,   Does not smoke. No recent antibiotics.   Treatment to date: antihistamines, cough suppressants and decongestants.  Denies sick contacts.  No other aggravating or relieving factors.  No other c/o.  ROS as in subjective.   Objective: Vitals:   05/17/18 1550  BP: 120/70  Pulse: 66  Resp: 16  Temp: (!) 97.5 F (36.4 C)  SpO2: 98%    General appearance: Alert, WD/WN, no distress, mildly ill appearing                             Skin: warm, no rash                           Head: no sinus tenderness                            Eyes: conjunctiva normal, corneas clear, PERRLA                            Ears: pearly TMs, external ear canals normal                          Nose: septum midline, turbinates swollen, with erythema and clear discharge             Mouth/throat: MMM, tongue normal, mild pharyngeal erythema                           Neck: supple, no adenopathy, no thyromegaly, nontender                          Heart: RRR, normal S1, S2, no murmurs                         Lungs: CTA bilaterally, no wheezes, rales, or rhonchi      Assessment: Productive cough - Plan: benzonatate (TESSALON) 200 MG capsule, azithromycin (ZITHROMAX Z-PAK) 250 MG tablet  Acute pharyngitis, unspecified etiology    Plan: Discussed diagnosis and treatment of productive cough and what sounds to be a superimposed bacterial infection that initially started with an acute URI.  Z-pak and Tessalon prescribed.  Suggested symptomatic OTC  remedies such as Mucinex, salt water gargles.  Nasal saline spray for congestion.  Tylenol or Ibuprofen OTC for fever and malaise.   Call/return if worsening or not back to baseline in 10 days.

## 2018-05-17 NOTE — Patient Instructions (Signed)
Take the antibiotic as prescribed.  Drink plenty of water. Take Mucinex. Try Tessalon Perles for cough as well. You can use salt water gargles and Chloraseptic if needed for throat irritation. Tylenol or ibuprofen for pain.  If your symptoms worsen at all or if you do not feel back to baseline after day 10 of starting the antibiotic, let me know.

## 2018-05-30 ENCOUNTER — Other Ambulatory Visit: Payer: Self-pay | Admitting: Family Medicine

## 2018-08-02 ENCOUNTER — Other Ambulatory Visit: Payer: Self-pay | Admitting: Family Medicine

## 2018-08-26 ENCOUNTER — Other Ambulatory Visit: Payer: Self-pay | Admitting: Family Medicine

## 2018-10-26 ENCOUNTER — Ambulatory Visit (INDEPENDENT_AMBULATORY_CARE_PROVIDER_SITE_OTHER): Payer: Managed Care, Other (non HMO) | Admitting: Family Medicine

## 2018-10-26 ENCOUNTER — Other Ambulatory Visit: Payer: Self-pay

## 2018-10-26 ENCOUNTER — Other Ambulatory Visit: Payer: Self-pay | Admitting: Family Medicine

## 2018-10-26 ENCOUNTER — Encounter: Payer: Self-pay | Admitting: Family Medicine

## 2018-10-26 VITALS — BP 120/82 | HR 59 | Ht 65.0 in | Wt 206.6 lb

## 2018-10-26 DIAGNOSIS — E785 Hyperlipidemia, unspecified: Secondary | ICD-10-CM | POA: Diagnosis not present

## 2018-10-26 DIAGNOSIS — Z0000 Encounter for general adult medical examination without abnormal findings: Secondary | ICD-10-CM

## 2018-10-26 DIAGNOSIS — I10 Essential (primary) hypertension: Secondary | ICD-10-CM | POA: Diagnosis not present

## 2018-10-26 DIAGNOSIS — Z5320 Procedure and treatment not carried out because of patient's decision for unspecified reasons: Secondary | ICD-10-CM | POA: Diagnosis not present

## 2018-10-26 DIAGNOSIS — E2839 Other primary ovarian failure: Secondary | ICD-10-CM

## 2018-10-26 DIAGNOSIS — Z1231 Encounter for screening mammogram for malignant neoplasm of breast: Secondary | ICD-10-CM

## 2018-10-26 LAB — POCT URINALYSIS DIP (PROADVANTAGE DEVICE)
Bilirubin, UA: NEGATIVE
Blood, UA: NEGATIVE
Glucose, UA: NEGATIVE mg/dL
Ketones, POC UA: NEGATIVE mg/dL
Nitrite, UA: NEGATIVE
Protein Ur, POC: NEGATIVE mg/dL
Specific Gravity, Urine: 1.015
Urobilinogen, Ur: NEGATIVE
pH, UA: 6.5 (ref 5.0–8.0)

## 2018-10-26 NOTE — Progress Notes (Signed)
Subjective:    Patient ID: Brittany Mooney, female    DOB: July 31, 1955, 63 y.o.   MRN: [id number]  HPI Chief Complaint  Patient presents with  . fasting cpe    fasting cpe, gets eyes checked yearly   She is here for a complete physical exam. Last CPE: 10/2017  Other providers: Dermatologist- Dr. Michele Mcalpine in [place] in the past.   Reports good daily compliance with medications for hypertension and hyperlipidemia and no side effects.  No concerns.  Depression screen [facility] 2/9 10/26/2018 10/24/2017 10/21/2016  Decreased Interest 0 0 0  Down, Depressed, Hopeless 0 0 0  PHQ - 2 Score 0 0 0     Social history: Lives with husband, granddaughter. 2 children with alcoholism. works at [employer], finance area, desk job Denies smoking, drug use. Alcohol use social.  Diet: lately unhealthy  Excerise: nothing regular. 3,000-4,000   Immunizations: UTD   Health maintenance:  Mammogram: May 2019 Colonoscopy: never. Mother with colon cancer.  Last Gynecological Exam: last year  Last Menstrual cycle: hysterectomy at age 68 fibroids. Menopause in her early 70s.  Last Dental Exam: 6 months ago  Last Eye Exam: annually   Wears seatbelt always, uses sunscreen, smoke detectors in home and functioning, does not text while driving and feels safe in home environment.   Reviewed allergies, medications, past medical, surgical, family, and social history.   Review of Systems Review of Systems Constitutional: -fever, -chills, -sweats, -unexpected weight change,-fatigue ENT: -runny nose, -ear pain, -sore throat Cardiology:  -chest pain, -palpitations, -edema Respiratory: -cough, -shortness of breath, -wheezing Gastroenterology: -abdominal pain, -nausea, -vomiting, -diarrhea, -constipation  Hematology: -bleeding or bruising problems Musculoskeletal: -arthralgias, -myalgias, -joint swelling, -back pain Ophthalmology: -vision changes Urology: -dysuria, -difficulty urinating,  -hematuria, -urinary frequency, -urgency Neurology: -headache, -weakness, -tingling, -numbness       Objective:   Physical Exam BP 120/82   Pulse (!) 59   Ht 5\' 5"  (1.651 m)   Wt 206 lb 9.6 oz (93.7 kg)   BMI 34.38 kg/m   General Appearance:    Alert, cooperative, no distress, appears stated age  Head:    Normocephalic, without obvious abnormality, atraumatic  Eyes:    PERRL, conjunctiva/corneas clear, EOM's intact, fundi    benign  Ears:    Normal TM's and external ear canals  Nose:   Nares normal, mucosa normal, no drainage or sinus   tenderness  Throat:   Lips, mucosa, and tongue normal; teeth and gums normal  Neck:   Supple, no lymphadenopathy;  thyroid:  no   enlargement/tenderness/nodules; no carotid   bruit or JVD  Back:    Spine nontender, no curvature, ROM normal, no CVA     tenderness  Lungs:     Clear to auscultation bilaterally without wheezes, rales or     ronchi; respirations unlabored  Chest Wall:    No tenderness or deformity   Heart:    Regular rate and rhythm, S1 and S2 normal, no murmur, rub   or gallop  Breast Exam:    Declined. Mammogram ordered  Abdomen:     Soft, non-tender, nondistended, normoactive bowel sounds,    no masses, no hepatosplenomegaly  Genitalia:    Normal external genitalia without lesions.  BUS and vagina normal. No abnormal vaginal discharge.  adnexa not enlarged, nontender, no masses.      Extremities:   No clubbing, cyanosis or edema  Pulses:   2+ and symmetric all extremities  Skin:   Skin  color, texture, turgor normal, no rashes or lesions  Lymph nodes:   Cervical, supraclavicular, and axillary nodes normal  Neurologic:   CNII-XII intact, normal strength, sensation and gait; reflexes 2+ and symmetric throughout          Psych:   Normal mood, affect, hygiene and grooming.    Urinalysis dipstick:  Trace leuk, negative otherwise      Assessment & Plan:  Routine general medical examination at a health care facility - Plan: POCT  Urinalysis DIP (Proadvantage Device), CBC with Differential/Platelet, Comprehensive metabolic panel, TSH, Lipid panel  Essential hypertension  Hyperlipidemia, unspecified hyperlipidemia type  Screening for malignant neoplasm of colon declined  Estrogen deficiency - Plan: DG Bone Density  She is here today for a fasting CPE.  Appears to be in her usual state of health.  No new concerns. Blood pressure is well controlled and no issues with current medications.  She will continue on the current medication regimen for hypertension and hyperlipidemia. She did not schedule a bone density last year as I recommended.  She will call to schedule annual mammogram and will also schedule the bone density at the breast center. I have recommended colonoscopy for the past 2 years and she did not call to schedule.  She does not give me a specific reason as to why she does not want to have this.  Her mother does have colon cancer which increases her risk.  She is aware. Discussed the possibility of a different type of screening test such as Cologuard or stool cards.  She states she will call GI and schedule. Up-to-date on immunizations. Counseled on healthy diet and exercise. Follow-up pending labs or in 6 months.

## 2018-10-26 NOTE — Patient Instructions (Addendum)
Call to schedule an appointment with [facility]. [phone] [place], [place]  Also call to schedule your mammogram and bone density tests at the [facility].   We will call you with your results.     Preventive Care 40-64 Years, Female Preventive care refers to lifestyle choices and visits with your health care provider that can promote health and wellness. What does preventive care include?   A yearly physical exam. This is also called an annual well check.  Dental exams once or twice a year.  Routine eye exams. Ask your health care provider how often you should have your eyes checked.  Personal lifestyle choices, including: ? Daily care of your teeth and gums. ? Regular physical activity. ? Eating a healthy diet. ? Avoiding tobacco and drug use. ? Limiting alcohol use. ? Practicing safe sex. ? Taking low-dose aspirin daily starting at age 68. ? Taking vitamin and mineral supplements as recommended by your health care provider. What happens during an annual well check? The services and screenings done by your health care provider during your annual well check will depend on your age, overall health, lifestyle risk factors, and family history of disease. Counseling Your health care provider may ask you questions about your:  Alcohol use.  Tobacco use.  Drug use.  Emotional well-being.  Home and relationship well-being.  Sexual activity.  Eating habits.  Work and work Statistician.  Method of birth control.  Menstrual cycle.  Pregnancy history. Screening You may have the following tests or measurements:  Height, weight, and BMI.  Blood pressure.  Lipid and cholesterol levels. These may be checked every 5 years, or more frequently if you are over 58 years old.  Skin check.  Lung cancer screening. You may have this screening every year starting at age 25 if you have a 30-pack-year history of smoking and currently smoke or have quit  within the past 15 years.  Colorectal cancer screening. All adults should have this screening starting at age 62 and continuing until age 15. Your health care provider may recommend screening at age 21. You will have tests every 1-10 years, depending on your results and the type of screening test. People at increased risk should start screening at an earlier age. Screening tests may include: ? Guaiac-based fecal occult blood testing. ? Fecal immunochemical test (FIT). ? Stool DNA test. ? Virtual colonoscopy. ? Sigmoidoscopy. During this test, a flexible tube with a tiny camera (sigmoidoscope) is used to examine your rectum and lower colon. The sigmoidoscope is inserted through your anus into your rectum and lower colon. ? Colonoscopy. During this test, a long, thin, flexible tube with a tiny camera (colonoscope) is used to examine your entire colon and rectum.  Hepatitis C blood test.  Hepatitis B blood test.  Sexually transmitted disease (STD) testing.  Diabetes screening. This is done by checking your blood sugar (glucose) after you have not eaten for a while (fasting). You may have this done every 1-3 years.  Mammogram. This may be done every 1-2 years. Talk to your health care provider about when you should start having regular mammograms. This may depend on whether you have a family history of breast cancer.  BRCA-related cancer screening. This may be done if you have a family history of breast, ovarian, tubal, or peritoneal cancers.  Pelvic exam and Pap test. This may be done every 3 years starting at age 37. Starting at age 45, this may be done every 5  years if you have a Pap test in combination with an HPV test.  Bone density scan. This is done to screen for osteoporosis. You may have this scan if you are at high risk for osteoporosis. Discuss your test results, treatment options, and if necessary, the need for more tests with your health care provider. Vaccines Your health care  provider may recommend certain vaccines, such as:  Influenza vaccine. This is recommended every year.  Tetanus, diphtheria, and acellular pertussis (Tdap, Td) vaccine. You may need a Td booster every 10 years.  Varicella vaccine. You may need this if you have not been vaccinated.  Zoster vaccine. You may need this after age 78.  Measles, mumps, and rubella (MMR) vaccine. You may need at least one dose of MMR if you were born in 1957 or later. You may also need a second dose.  Pneumococcal 13-valent conjugate (PCV13) vaccine. You may need this if you have certain conditions and were not previously vaccinated.  Pneumococcal polysaccharide (PPSV23) vaccine. You may need one or two doses if you smoke cigarettes or if you have certain conditions.  Meningococcal vaccine. You may need this if you have certain conditions.  Hepatitis A vaccine. You may need this if you have certain conditions or if you travel or work in places where you may be exposed to hepatitis A.  Hepatitis B vaccine. You may need this if you have certain conditions or if you travel or work in places where you may be exposed to hepatitis B.  Haemophilus influenzae type b (Hib) vaccine. You may need this if you have certain conditions. Talk to your health care provider about which screenings and vaccines you need and how often you need them. This information is not intended to replace advice given to you by your health care provider. Make sure you discuss any questions you have with your health care provider. Document Released: 06/27/2015 Document Revised: 07/21/2017 Document Reviewed: 04/01/2015 Elsevier Interactive Patient Education  2019 [employer].

## 2018-10-27 LAB — COMPREHENSIVE METABOLIC PANEL
ALT: 40 IU/L — ABNORMAL HIGH (ref 0–32)
AST: 27 IU/L (ref 0–40)
Albumin/Globulin Ratio: 1.6 (ref 1.2–2.2)
Albumin: 4.6 g/dL (ref 3.8–4.8)
Alkaline Phosphatase: 55 IU/L (ref 39–117)
BUN/Creatinine Ratio: 16 (ref 12–28)
BUN: 16 mg/dL (ref 8–27)
Bilirubin Total: 0.3 mg/dL (ref 0.0–1.2)
CO2: 25 mmol/L (ref 20–29)
Calcium: 10.1 mg/dL (ref 8.7–10.3)
Chloride: 104 mmol/L (ref 96–106)
Creatinine, Ser: 0.97 mg/dL (ref 0.57–1.00)
GFR calc Af Amer: 73 mL/min/{1.73_m2} (ref >59)
GFR calc non Af Amer: 63 mL/min/{1.73_m2} (ref >59)
Globulin, Total: 2.8 g/dL (ref 1.5–4.5)
Glucose: 116 mg/dL — ABNORMAL HIGH (ref 65–99)
Potassium: 4.5 mmol/L (ref 3.5–5.2)
Sodium: 142 mmol/L (ref 134–144)
Total Protein: 7.4 g/dL (ref 6.0–8.5)

## 2018-10-27 LAB — CBC WITH DIFFERENTIAL/PLATELET
Basophils Absolute: 0 10*3/uL (ref 0.0–0.2)
Basos: 1 %
EOS (ABSOLUTE): 0.1 10*3/uL (ref 0.0–0.4)
Eos: 3 %
Hematocrit: 41.9 % (ref 34.0–46.6)
Hemoglobin: 14.4 g/dL (ref 11.1–15.9)
Immature Grans (Abs): 0 10*3/uL (ref 0.0–0.1)
Immature Granulocytes: 0 %
Lymphocytes Absolute: 2.1 10*3/uL (ref 0.7–3.1)
Lymphs: 38 %
MCH: 30.3 pg (ref 26.6–33.0)
MCHC: 34.4 g/dL (ref 31.5–35.7)
MCV: 88 fL (ref 79–97)
Monocytes Absolute: 0.5 10*3/uL (ref 0.1–0.9)
Monocytes: 9 %
Neutrophils Absolute: 2.8 10*3/uL (ref 1.4–7.0)
Neutrophils: 49 %
Platelets: 280 10*3/uL (ref 150–450)
RBC: 4.75 x10E6/uL (ref 3.77–5.28)
RDW: 12.5 % (ref 11.7–15.4)
WBC: 5.5 10*3/uL (ref 3.4–10.8)

## 2018-10-27 LAB — LIPID PANEL
Chol/HDL Ratio: 3.2 ratio (ref 0.0–4.4)
Cholesterol, Total: 188 mg/dL (ref 100–199)
HDL: 59 mg/dL (ref >39)
LDL Calculated: 114 mg/dL — ABNORMAL HIGH (ref 0–99)
Triglycerides: 76 mg/dL (ref 0–149)
VLDL Cholesterol Cal: 15 mg/dL (ref 5–40)

## 2018-10-27 LAB — TSH: TSH: 3.13 u[IU]/mL (ref 0.450–4.500)

## 2018-10-28 ENCOUNTER — Encounter: Payer: Self-pay | Admitting: Family Medicine

## 2018-10-28 DIAGNOSIS — R7401 Elevation of levels of liver transaminase levels: Secondary | ICD-10-CM | POA: Insufficient documentation

## 2018-10-28 DIAGNOSIS — R7303 Prediabetes: Secondary | ICD-10-CM | POA: Insufficient documentation

## 2018-10-28 DIAGNOSIS — R740 Nonspecific elevation of levels of transaminase and lactic acid dehydrogenase [LDH]: Secondary | ICD-10-CM

## 2018-10-31 LAB — HGB A1C W/O EAG: Hgb A1c MFr Bld: 6 % — ABNORMAL HIGH (ref 4.8–5.6)

## 2018-10-31 LAB — SPECIMEN STATUS REPORT

## 2018-11-26 ENCOUNTER — Other Ambulatory Visit: Payer: Self-pay | Admitting: Family Medicine

## 2019-01-18 ENCOUNTER — Ambulatory Visit
Admission: RE | Admit: 2019-01-18 | Discharge: 2019-01-18 | Disposition: A | Discharge status: Home / Self Care | Payer: Managed Care, Other (non HMO) | Source: Ambulatory Visit | Attending: Family Medicine | Admitting: Family Medicine

## 2019-01-18 ENCOUNTER — Other Ambulatory Visit: Payer: Self-pay

## 2019-01-18 DIAGNOSIS — E2839 Other primary ovarian failure: Secondary | ICD-10-CM

## 2019-01-18 DIAGNOSIS — Z1231 Encounter for screening mammogram for malignant neoplasm of breast: Secondary | ICD-10-CM

## 2019-02-02 ENCOUNTER — Other Ambulatory Visit: Payer: Self-pay | Admitting: Family Medicine

## 2019-02-18 ENCOUNTER — Other Ambulatory Visit: Payer: Self-pay | Admitting: Family Medicine

## 2019-03-12 ENCOUNTER — Other Ambulatory Visit (INDEPENDENT_AMBULATORY_CARE_PROVIDER_SITE_OTHER): Payer: Managed Care, Other (non HMO)

## 2019-03-12 ENCOUNTER — Other Ambulatory Visit: Payer: Self-pay

## 2019-03-12 DIAGNOSIS — Z23 Encounter for immunization: Secondary | ICD-10-CM | POA: Diagnosis not present

## 2019-05-03 ENCOUNTER — Other Ambulatory Visit: Payer: Self-pay | Admitting: Family Medicine

## 2019-06-15 HISTORY — PX: CATARACT EXTRACTION: SUR2

## 2019-08-05 ENCOUNTER — Other Ambulatory Visit: Payer: Self-pay | Admitting: Family Medicine

## 2019-08-29 ENCOUNTER — Other Ambulatory Visit: Payer: Self-pay | Admitting: Family Medicine

## 2019-11-06 ENCOUNTER — Other Ambulatory Visit: Payer: Self-pay | Admitting: Family Medicine

## 2019-11-06 MED ORDER — HYDROCHLOROTHIAZIDE 25 MG PO TABS: 25.0000 mg | ORAL_TABLET | Freq: Every day | ORAL | 0 refills | Status: DC

## 2019-11-06 NOTE — Addendum Note (Signed)
Addended by: Minette Headland A on: 11/06/2019 02:06 PM   Modules accepted: Orders

## 2019-11-06 NOTE — Telephone Encounter (Signed)
Pt has cpe on June 24th

## 2019-11-08 ENCOUNTER — Other Ambulatory Visit: Payer: Self-pay | Admitting: Family Medicine

## 2019-11-08 NOTE — Telephone Encounter (Signed)
Refilled the other day for 30 days only as pt has been in in over a year and has an appt 6/24

## 2019-12-05 NOTE — Progress Notes (Signed)
Subjective:    Patient ID: Brittany Mooney, female    DOB: 05-Mar-1956, 64 y.o.   MRN: [id number]  HPI Chief Complaint  Patient presents with  . fasting cpe    fasting cpe, no other concerns. doesn't see obgyn   She is here for a complete physical exam and to follow-up on chronic health conditions. Previous medical care: Last CPE: 10/2018  States her life is "stressful".  She is raising her grandchild and disabled husband.   She has taken up gardening to help with the stress.   HTN- BP at home has been stable and in goal range. Taking her medications everyday without any concerns.   States she has been out of her pravastatin for the past 2 nights. Takes it daily.  No concerns.  Prediabetes with her last A1c 6.0%.   Social history: Lives with husband and Curator. works at [employer], finance area, desk job She feels that her diet has not been healthy over the past year.  She has been working from home. No recent exercise.  She does have a stationary bike that she has good intentions on using.  Alcohol use over the past year has been more but recently cut it back to a glass of wine on Saturdays.  Denies smoking or drug use.   Immunizations: [employer] received. Tdap UTD   Health maintenance:  Mammogram: last year  Colonoscopy: never. Mother with colon cancer.  Last Gynecological Exam:over a year ago  Last Menstrual cycle: hysterectomy at age 29 for fibroids. Menopause.  DEXA: 2020 and normal  Last Dental Exam: over a year ago  Last Eye Exam: over a year ago   Wears seatbelt always, uses sunscreen, smoke detectors in home and functioning, does not text while driving and feels safe in home environment.   Reviewed allergies, medications, past medical, surgical, family, and social history.   Review of Systems Review of Systems Constitutional: -fever, -chills, -sweats, -unexpected weight change,-fatigue ENT: -runny nose, -ear pain, -sore  throat Cardiology:  -chest pain, -palpitations, -edema Respiratory: -cough, -shortness of breath, -wheezing Gastroenterology: -abdominal pain, -nausea, -vomiting, -diarrhea, -constipation  Hematology: -bleeding or bruising problems Musculoskeletal: -arthralgias, -myalgias, -joint swelling, -back pain Ophthalmology: -vision changes Urology: -dysuria, -difficulty urinating, -hematuria, -urinary frequency, -urgency Neurology: -headache, -weakness, -tingling, -numbness       Objective:   Physical Exam BP 120/70   Pulse 60   Ht 5\' 6"  (1.676 m)   Wt 215 lb 3.2 oz (97.6 kg)   SpO2 99%   BMI 34.73 kg/m   General Appearance:    Alert, cooperative, no distress, appears stated age  Head:    Normocephalic, without obvious abnormality, atraumatic  Eyes:    PERRL, conjunctiva/corneas clear, EOM's intact  Ears:    Normal TM's and external ear canals  Nose:   Mask on   Throat:   Mask on   Neck:   Supple, no lymphadenopathy;  thyroid:  no   enlargement/tenderness/nodules; no JVD  Back:    Spine nontender, no curvature, ROM normal, no CVA     tenderness  Lungs:     Clear to auscultation bilaterally without wheezes, rales or     ronchi; respirations unlabored  Chest Wall:    No tenderness or deformity   Heart:    Regular rate and rhythm, S1 and S2 normal, no murmur, rub   or gallop  Breast Exam:    Declines.   Abdomen:     Soft, non-tender, nondistended, normoactive  bowel sounds,    no masses, no hepatosplenomegaly  Genitalia:    Declines      Extremities:   No clubbing, cyanosis or edema  Pulses:   2+ and symmetric all extremities  Skin:   Skin color, texture, turgor normal, no rashes or lesions  Lymph nodes:   Cervical, supraclavicular, and axillary nodes normal  Neurologic:   CNII-XII intact, normal strength, sensation and gait; reflexes 2+ and symmetric throughout          Psych:   Normal mood, affect, hygiene and grooming.          Assessment & Plan:  Routine general medical  examination at a health care facility - Plan: CBC with Differential/Platelet, Comprehensive metabolic panel, TSH, T4, free, T3 -Here today for fasting CPE.  Preventive health care reviewed.  She is up-to-date on mammogram and bone density.  She has had a hysterectomy.  Declines pelvic exam. She has never had a colonoscopy even though her mother was diagnosed with colon cancer.  Once again I encouraged her to call and schedule this.  She does report planning on getting this done at some point.  Counseling on healthy lifestyle including diet and exercise and stress reduction. Immunizations reviewed.  She may call her insurance company and find out if the Shingrix vaccine is affordable and then return for a nurse visit.  Her Covid vaccines are current but we do not have these dates.  She may call with those dates and we will enter them into her EMR.  Discussed safety and health promotion.  Prediabetes - Plan: Hemoglobin A1c -Hemoglobin A1c 6.0% last year.  Counseling on healthy diet and exercise.  We will check her A1c and follow-up  Essential hypertension - Plan: CBC with Differential/Platelet, EKG 12-Lead -Blood pressures well controlled.  Continue on current medications.  Hyperlipidemia, unspecified hyperlipidemia type - Plan: Lipid panel -Discussed that her LDL was still elevated last year and I will recommend increasing her dose of pravastatin or switching to a different medication depending on lipid panel results.  Estrogen deficiency -Bone density normal in August 2020.  Obesity (BMI 30-39.9) - Plan: TSH, T4, free, T3 -Counseling on reducing portion sizes, cutting back on sugar and carbohydrates and increasing physical activity.  Screening for heart disease - Plan: EKG 12-Lead EKG shows sinus bradycardia with rate 57, unremarkable otherwise   Stress -discussed taking time for herself and increasing outdoor walking or using her indoor stationary bike. She has started gardening. Try  meditation or distraction when needed.  She has a therapist and will call to schedule. Declines need for medication.

## 2019-12-05 NOTE — Patient Instructions (Addendum)
It was a pleasure seeing you today.   Call and schedule with a counselor.  Take 10 minutes every day to exercise. Gradually increase this to 15 mins, 20, etc  Call and schedule with [facility] for your screening colonoscopy.   Call and schedule your dental and eye exams.   Check on the Shingrix vaccine and see if this is affordable.  You can schedule a nurse visit to get this here if you would like. It is a 2 shot series.   We will be in touch with your lab results and refill your medications.      Preventive Care 63-7 Years Old, Female Preventive care refers to visits with your health care provider and lifestyle choices that can promote health and wellness. This includes:  A yearly physical exam. This may also be called an annual well check.  Regular dental visits and eye exams.  Immunizations.  Screening for certain conditions.  Healthy lifestyle choices, such as eating a healthy diet, getting regular exercise, not using drugs or products that contain nicotine and tobacco, and limiting alcohol use. What can I expect for my preventive care visit? Physical exam Your health care provider will check your:  Height and weight. This may be used to calculate body mass index (BMI), which tells if you are at a healthy weight.  Heart rate and blood pressure.  Skin for abnormal spots. Counseling Your health care provider may ask you questions about your:  Alcohol, tobacco, and drug use.  Emotional well-being.  Home and relationship well-being.  Sexual activity.  Eating habits.  Work and work Statistician.  Method of birth control.  Menstrual cycle.  Pregnancy history. What immunizations do I need?  Influenza (flu) vaccine  This is recommended every year. Tetanus, diphtheria, and pertussis (Tdap) vaccine  You may need a Td booster every 10 years. Varicella (chickenpox) vaccine  You may need this if you have not been vaccinated. Zoster (shingles) vaccine  You  may need this after age 63. Measles, mumps, and rubella (MMR) vaccine  You may need at least one dose of MMR if you were born in 1957 or later. You may also need a second dose. Pneumococcal conjugate (PCV13) vaccine  You may need this if you have certain conditions and were not previously vaccinated. Pneumococcal polysaccharide (PPSV23) vaccine  You may need one or two doses if you smoke cigarettes or if you have certain conditions. Meningococcal conjugate (MenACWY) vaccine  You may need this if you have certain conditions. Hepatitis A vaccine  You may need this if you have certain conditions or if you travel or work in places where you may be exposed to hepatitis A. Hepatitis B vaccine  You may need this if you have certain conditions or if you travel or work in places where you may be exposed to hepatitis B. Haemophilus influenzae type b (Hib) vaccine  You may need this if you have certain conditions. Human papillomavirus (HPV) vaccine  If recommended by your health care provider, you may need three doses over 6 months. You may receive vaccines as individual doses or as more than one vaccine together in one shot (combination vaccines). Talk with your health care provider about the risks and benefits of combination vaccines. What tests do I need? Blood tests  Lipid and cholesterol levels. These may be checked every 5 years, or more frequently if you are over 63 years old.  Hepatitis C test.  Hepatitis B test. Screening  Lung cancer screening. You  may have this screening every year starting at age 63 if you have a 30-pack-year history of smoking and currently smoke or have quit within the past 15 years.  Colorectal cancer screening. All adults should have this screening starting at age 63 and continuing until age 63. Your health care provider may recommend screening at age 2 if you are at increased risk. You will have tests every 1-10 years, depending on your results and the  type of screening test.  Diabetes screening. This is done by checking your blood sugar (glucose) after you have not eaten for a while (fasting). You may have this done every 1-3 years.  Mammogram. This may be done every 1-2 years. Talk with your health care provider about when you should start having regular mammograms. This may depend on whether you have a family history of breast cancer.  BRCA-related cancer screening. This may be done if you have a family history of breast, ovarian, tubal, or peritoneal cancers.  Pelvic exam and Pap test. This may be done every 3 years starting at age 63. Starting at age 63, this may be done every 5 years if you have a Pap test in combination with an HPV test. Other tests  Sexually transmitted disease (STD) testing.  Bone density scan. This is done to screen for osteoporosis. You may have this scan if you are at high risk for osteoporosis. Follow these instructions at home: Eating and drinking  Eat a diet that includes fresh fruits and vegetables, whole grains, lean protein, and low-fat dairy.  Take vitamin and mineral supplements as recommended by your health care provider.  Do not drink alcohol if: ? Your health care provider tells you not to drink. ? You are pregnant, may be pregnant, or are planning to become pregnant.  If you drink alcohol: ? Limit how much you have to 0-1 drink a day. ? Be aware of how much alcohol is in your drink. In the [place] one drink equals one 12 oz bottle of beer (355 mL), one 5 oz glass of wine (148 mL), or one 1 oz glass of hard liquor (44 mL). Lifestyle  Take daily care of your teeth and gums.  Stay active. Exercise for at least 30 minutes on 5 or more days each week.  Do not use any products that contain nicotine or tobacco, such as cigarettes, e-cigarettes, and chewing tobacco. If you need help quitting, ask your health care provider.  If you are sexually active, practice safe sex. Use a condom or other form  of birth control (contraception) in order to prevent pregnancy and STIs (sexually transmitted infections).  If told by your health care provider, take low-dose aspirin daily starting at age 63. What's next?  Visit your health care provider once a year for a well check visit.  Ask your health care provider how often you should have your eyes and teeth checked.  Stay up to date on all vaccines. This information is not intended to replace advice given to you by your health care provider. Make sure you discuss any questions you have with your health care provider. Document Revised: 02/09/2018 Document Reviewed: 02/09/2018 Elsevier Patient Education  2020 [employer].

## 2019-12-06 ENCOUNTER — Other Ambulatory Visit: Payer: Self-pay

## 2019-12-06 ENCOUNTER — Ambulatory Visit: Payer: Managed Care, Other (non HMO) | Admitting: Family Medicine

## 2019-12-06 ENCOUNTER — Encounter: Payer: Self-pay | Admitting: Family Medicine

## 2019-12-06 VITALS — BP 120/70 | HR 60 | Ht 66.0 in | Wt 215.2 lb

## 2019-12-06 DIAGNOSIS — Z136 Encounter for screening for cardiovascular disorders: Secondary | ICD-10-CM | POA: Diagnosis not present

## 2019-12-06 DIAGNOSIS — Z0000 Encounter for general adult medical examination without abnormal findings: Secondary | ICD-10-CM

## 2019-12-06 DIAGNOSIS — E2839 Other primary ovarian failure: Secondary | ICD-10-CM | POA: Insufficient documentation

## 2019-12-06 DIAGNOSIS — I10 Essential (primary) hypertension: Secondary | ICD-10-CM

## 2019-12-06 DIAGNOSIS — E669 Obesity, unspecified: Secondary | ICD-10-CM | POA: Insufficient documentation

## 2019-12-06 DIAGNOSIS — R7303 Prediabetes: Secondary | ICD-10-CM

## 2019-12-06 DIAGNOSIS — E785 Hyperlipidemia, unspecified: Secondary | ICD-10-CM | POA: Diagnosis not present

## 2019-12-06 DIAGNOSIS — F439 Reaction to severe stress, unspecified: Secondary | ICD-10-CM

## 2019-12-07 LAB — CBC WITH DIFFERENTIAL/PLATELET
Basophils Absolute: 0.1 10*3/uL (ref 0.0–0.2)
Basos: 1 %
EOS (ABSOLUTE): 0.2 10*3/uL (ref 0.0–0.4)
Eos: 3 %
Hematocrit: 46.2 % (ref 34.0–46.6)
Hemoglobin: 15.3 g/dL (ref 11.1–15.9)
Immature Grans (Abs): 0 10*3/uL (ref 0.0–0.1)
Immature Granulocytes: 0 %
Lymphocytes Absolute: 2 10*3/uL (ref 0.7–3.1)
Lymphs: 34 %
MCH: 30.7 pg (ref 26.6–33.0)
MCHC: 33.1 g/dL (ref 31.5–35.7)
MCV: 93 fL (ref 79–97)
Monocytes Absolute: 0.5 10*3/uL (ref 0.1–0.9)
Monocytes: 9 %
Neutrophils Absolute: 3.1 10*3/uL (ref 1.4–7.0)
Neutrophils: 53 %
Platelets: 298 10*3/uL (ref 150–450)
RBC: 4.99 x10E6/uL (ref 3.77–5.28)
RDW: 12.4 % (ref 11.7–15.4)
WBC: 5.8 10*3/uL (ref 3.4–10.8)

## 2019-12-07 LAB — COMPREHENSIVE METABOLIC PANEL
ALT: 51 IU/L — ABNORMAL HIGH (ref 0–32)
AST: 32 IU/L (ref 0–40)
Albumin/Globulin Ratio: 1.5 (ref 1.2–2.2)
Albumin: 4.7 g/dL (ref 3.8–4.8)
Alkaline Phosphatase: 55 IU/L (ref 48–121)
BUN/Creatinine Ratio: 20 (ref 12–28)
BUN: 19 mg/dL (ref 8–27)
Bilirubin Total: 0.3 mg/dL (ref 0.0–1.2)
CO2: 25 mmol/L (ref 20–29)
Calcium: 10.1 mg/dL (ref 8.7–10.3)
Chloride: 102 mmol/L (ref 96–106)
Creatinine, Ser: 0.94 mg/dL (ref 0.57–1.00)
GFR calc Af Amer: 75 mL/min/{1.73_m2} (ref >59)
GFR calc non Af Amer: 65 mL/min/{1.73_m2} (ref >59)
Globulin, Total: 3.1 g/dL (ref 1.5–4.5)
Glucose: 120 mg/dL — ABNORMAL HIGH (ref 65–99)
Potassium: 4.3 mmol/L (ref 3.5–5.2)
Sodium: 140 mmol/L (ref 134–144)
Total Protein: 7.8 g/dL (ref 6.0–8.5)

## 2019-12-07 LAB — TSH: TSH: 3.13 u[IU]/mL (ref 0.450–4.500)

## 2019-12-07 LAB — LIPID PANEL
Chol/HDL Ratio: 3.4 ratio (ref 0.0–4.4)
Cholesterol, Total: 207 mg/dL — ABNORMAL HIGH (ref 100–199)
HDL: 61 mg/dL (ref >39)
LDL Chol Calc (NIH): 129 mg/dL — ABNORMAL HIGH (ref 0–99)
Triglycerides: 95 mg/dL (ref 0–149)
VLDL Cholesterol Cal: 17 mg/dL (ref 5–40)

## 2019-12-07 LAB — HEMOGLOBIN A1C
Est. average glucose Bld gHb Est-mCnc: 131 mg/dL
Hgb A1c MFr Bld: 6.2 % — ABNORMAL HIGH (ref 4.8–5.6)

## 2019-12-07 LAB — T3: T3, Total: 109 ng/dL (ref 71–180)

## 2019-12-07 LAB — T4, FREE: Free T4: 1.09 ng/dL (ref 0.82–1.77)

## 2019-12-10 ENCOUNTER — Encounter: Payer: Self-pay | Admitting: Family Medicine

## 2019-12-10 ENCOUNTER — Other Ambulatory Visit: Payer: Self-pay | Admitting: Family Medicine

## 2019-12-10 DIAGNOSIS — Z1231 Encounter for screening mammogram for malignant neoplasm of breast: Secondary | ICD-10-CM

## 2019-12-10 MED ORDER — HYDROCHLOROTHIAZIDE 25 MG PO TABS: 25.0000 mg | ORAL_TABLET | Freq: Every day | ORAL | 1 refills | Status: DC

## 2019-12-10 MED ORDER — PRAVASTATIN SODIUM 20 MG PO TABS: 20.0000 mg | ORAL_TABLET | Freq: Every day | ORAL | 1 refills | Status: DC

## 2019-12-10 MED ORDER — ATENOLOL 50 MG PO TABS: 50.0000 mg | ORAL_TABLET | Freq: Every day | ORAL | 1 refills | Status: DC

## 2020-01-21 ENCOUNTER — Ambulatory Visit: Payer: Managed Care, Other (non HMO)

## 2020-02-13 ENCOUNTER — Ambulatory Visit
Admission: RE | Admit: 2020-02-13 | Discharge: 2020-02-13 | Disposition: A | Discharge status: Home / Self Care | Payer: Managed Care, Other (non HMO) | Source: Ambulatory Visit | Attending: Family Medicine | Admitting: Family Medicine

## 2020-02-13 ENCOUNTER — Other Ambulatory Visit: Payer: Self-pay

## 2020-02-13 DIAGNOSIS — Z1231 Encounter for screening mammogram for malignant neoplasm of breast: Secondary | ICD-10-CM

## 2020-02-15 ENCOUNTER — Other Ambulatory Visit: Payer: Self-pay | Admitting: Family Medicine

## 2020-02-15 DIAGNOSIS — R928 Other abnormal and inconclusive findings on diagnostic imaging of breast: Secondary | ICD-10-CM

## 2020-02-28 ENCOUNTER — Other Ambulatory Visit: Payer: Self-pay

## 2020-02-28 ENCOUNTER — Ambulatory Visit
Admission: RE | Admit: 2020-02-28 | Discharge: 2020-02-28 | Disposition: A | Discharge status: Home / Self Care | Payer: Managed Care, Other (non HMO) | Source: Ambulatory Visit | Attending: Family Medicine | Admitting: Family Medicine

## 2020-02-28 DIAGNOSIS — R928 Other abnormal and inconclusive findings on diagnostic imaging of breast: Secondary | ICD-10-CM

## 2020-03-31 ENCOUNTER — Telehealth: Payer: Self-pay | Admitting: Family Medicine

## 2020-03-31 NOTE — Telephone Encounter (Signed)
Ok to get Coca-Cola booster due to her co-morbidities.

## 2020-03-31 NOTE — Telephone Encounter (Signed)
Pt called and scheduled a flu shot for Thursday. She would like to get a Coca-Cola booster while here. Per pt her last one was 09/27/2019. Please advise pt if ok.

## 2020-04-01 NOTE — Telephone Encounter (Signed)
Call pt and informed of to get Falcon per [place]. Will add to appointment.

## 2020-04-03 ENCOUNTER — Ambulatory Visit (INDEPENDENT_AMBULATORY_CARE_PROVIDER_SITE_OTHER): Payer: Managed Care, Other (non HMO)

## 2020-04-03 ENCOUNTER — Other Ambulatory Visit: Payer: Self-pay

## 2020-04-03 ENCOUNTER — Other Ambulatory Visit: Payer: Managed Care, Other (non HMO)

## 2020-04-03 DIAGNOSIS — Z23 Encounter for immunization: Secondary | ICD-10-CM | POA: Diagnosis not present

## 2020-06-10 ENCOUNTER — Other Ambulatory Visit: Payer: Self-pay | Admitting: Family Medicine

## 2020-06-11 ENCOUNTER — Encounter: Payer: Managed Care, Other (non HMO) | Admitting: Family Medicine

## 2020-09-09 ENCOUNTER — Other Ambulatory Visit: Payer: Self-pay | Admitting: Family Medicine

## 2020-09-09 NOTE — Telephone Encounter (Signed)
Pt is scheduled for june

## 2020-12-04 ENCOUNTER — Other Ambulatory Visit: Payer: Self-pay | Admitting: *Deleted

## 2020-12-04 ENCOUNTER — Telehealth: Payer: Self-pay

## 2020-12-04 MED ORDER — ATENOLOL 50 MG PO TABS: 50.0000 mg | ORAL_TABLET | Freq: Every day | ORAL | 0 refills | Status: DC

## 2020-12-04 MED ORDER — HYDROCHLOROTHIAZIDE 25 MG PO TABS: 1.0000 | ORAL_TABLET | Freq: Every day | ORAL | 0 refills | Status: DC

## 2020-12-04 MED ORDER — PRAVASTATIN SODIUM 20 MG PO TABS: 20.0000 mg | ORAL_TABLET | Freq: Every day | ORAL | 0 refills | Status: DC

## 2020-12-04 NOTE — Telephone Encounter (Signed)
Called pt and cancelled her CPE for next week, she needs refill of all her meds to hold her until Vickie returns, please send to [facility]

## 2020-12-04 NOTE — Telephone Encounter (Signed)
Done

## 2020-12-08 ENCOUNTER — Encounter: Payer: Managed Care, Other (non HMO) | Admitting: Family Medicine

## 2020-12-08 MED ORDER — HYDROCHLOROTHIAZIDE 25 MG PO TABS: 1.0000 | ORAL_TABLET | Freq: Every day | ORAL | 0 refills | Status: DC

## 2020-12-08 MED ORDER — PRAVASTATIN SODIUM 20 MG PO TABS: 20.0000 mg | ORAL_TABLET | Freq: Every day | ORAL | 0 refills | Status: DC

## 2020-12-08 MED ORDER — ATENOLOL 50 MG PO TABS: 50.0000 mg | ORAL_TABLET | Freq: Every day | ORAL | 0 refills | Status: DC

## 2020-12-08 NOTE — Telephone Encounter (Signed)
Sent in meds 

## 2021-01-14 ENCOUNTER — Other Ambulatory Visit: Payer: Self-pay | Admitting: Family Medicine

## 2021-01-14 DIAGNOSIS — Z1231 Encounter for screening mammogram for malignant neoplasm of breast: Secondary | ICD-10-CM

## 2021-03-05 ENCOUNTER — Other Ambulatory Visit: Payer: Self-pay

## 2021-03-05 ENCOUNTER — Ambulatory Visit
Admission: RE | Admit: 2021-03-05 | Discharge: 2021-03-05 | Disposition: A | Discharge status: Home / Self Care | Payer: Managed Care, Other (non HMO) | Source: Ambulatory Visit | Attending: Family Medicine | Admitting: Family Medicine

## 2021-03-05 DIAGNOSIS — Z1231 Encounter for screening mammogram for malignant neoplasm of breast: Secondary | ICD-10-CM

## 2021-03-09 ENCOUNTER — Ambulatory Visit: Payer: Managed Care, Other (non HMO) | Admitting: Family Medicine

## 2021-03-09 ENCOUNTER — Encounter: Payer: Self-pay | Admitting: Family Medicine

## 2021-03-09 ENCOUNTER — Other Ambulatory Visit: Payer: Self-pay

## 2021-03-09 VITALS — BP 136/82 | HR 60 | Ht 65.0 in | Wt 213.0 lb

## 2021-03-09 DIAGNOSIS — E785 Hyperlipidemia, unspecified: Secondary | ICD-10-CM | POA: Diagnosis not present

## 2021-03-09 DIAGNOSIS — Z0000 Encounter for general adult medical examination without abnormal findings: Secondary | ICD-10-CM | POA: Diagnosis not present

## 2021-03-09 DIAGNOSIS — Z23 Encounter for immunization: Secondary | ICD-10-CM | POA: Diagnosis not present

## 2021-03-09 DIAGNOSIS — D229 Melanocytic nevi, unspecified: Secondary | ICD-10-CM

## 2021-03-09 DIAGNOSIS — R7401 Elevation of levels of liver transaminase levels: Secondary | ICD-10-CM

## 2021-03-09 DIAGNOSIS — I10 Essential (primary) hypertension: Secondary | ICD-10-CM

## 2021-03-09 DIAGNOSIS — Z1211 Encounter for screening for malignant neoplasm of colon: Secondary | ICD-10-CM | POA: Diagnosis not present

## 2021-03-09 DIAGNOSIS — E669 Obesity, unspecified: Secondary | ICD-10-CM | POA: Diagnosis not present

## 2021-03-09 DIAGNOSIS — R7303 Prediabetes: Secondary | ICD-10-CM

## 2021-03-09 DIAGNOSIS — Z800 Family history of malignant neoplasm of digestive organs: Secondary | ICD-10-CM

## 2021-03-09 NOTE — Progress Notes (Signed)
Subjective:    Patient ID: Brittany Mooney, female    DOB: [birth year], 65 y.o.   MRN: [id number]  HPI Chief Complaint  Patient presents with   Annual Exam   She is here for a complete physical exam.  HTN- has not been taking Taking her medications everyday without any concerns.    Taking pravastatin daily. LDL 129 and not controlled as of 11/2019. She opted to not increase her statin or switch to a different one.    Prediabetes with her last A1c 6.2% in 11/2019   Social history: Lives with husband and Curator. works at [employer], finance area, desk job She feels that her diet has not been healthy over the past year.  She has been working from home. No recent exercise.  She does have a stationary bike that she has good intentions on using.  Immunizations: Covid booster and flu shot today. Tdap UTD.   Health maintenance:   Mammogram: 02/2021 Colonoscopy:  she did not get this.  mother with colon cancer  Last Gynecological Exam: hyster at age 64 for fibroids.  DEXA- normal in 2020  Last Dental Exam: 2020 Last Eye Exam: spring 2022  Wears seatbelt always, uses sunscreen, smoke detectors in home and functioning, does not text while driving and feels safe in home environment.   Reviewed allergies, medications, past medical, surgical, family, and social history.    Review of Systems Review of Systems Constitutional: -fever, -chills, -sweats, -unexpected weight change,-fatigue ENT: -runny nose, -ear pain, -sore throat Cardiology:  -chest pain, -palpitations, -edema Respiratory: -cough, -shortness of breath, -wheezing Gastroenterology: -abdominal pain, -nausea, -vomiting, -diarrhea, -constipation Hematology: -bleeding or bruising problems Musculoskeletal: -arthralgias, -myalgias, -joint swelling, -back pain Ophthalmology: -vision changes Urology: -dysuria, -difficulty urinating, -hematuria, -urinary frequency, -urgency Neurology: -headache, -weakness,  -tingling, -numbness       Objective:   Physical Exam BP 136/82   Pulse 60   Ht 5\' 5"  (1.651 m)   Wt 213 lb (96.6 kg)   SpO2 96%   BMI 35.45 kg/m   General Appearance:    Alert, cooperative, no distress, appears stated age  Head:    Normocephalic, without obvious abnormality, atraumatic  Eyes:    PERRL, conjunctiva/corneas clear, EOM's intact  Ears:    Normal TM's and external ear canals  Nose:   Mask on   Throat:   Mask on   Neck:   Supple, no lymphadenopathy;  thyroid:  no   enlargement/tenderness/nodules; no JVD  Back:    Spine nontender, no curvature, ROM normal, no CVA     tenderness  Lungs:     Clear to auscultation bilaterally without wheezes, rales or     ronchi; respirations unlabored  Chest Wall:    No tenderness or deformity   Heart:    Regular rate and rhythm, S1 and S2 normal, no murmur, rub   or gallop  Breast Exam:   Declines   Abdomen:     Soft, non-tender, nondistended, normoactive bowel sounds,    no masses, no hepatosplenomegaly  Genitalia:    Normal external genitalia without lesions.  BUS and vagina normal without discharge, absent cervix. Adnexa not enlarged, nontender, no masses.  Chaperone present.      Extremities:   No clubbing, cyanosis or edema  Pulses:   2+ and symmetric all extremities  Skin:   Skin color, texture, turgor normal, no rashes or lesions. Multiple nevi  Lymph nodes:   Cervical, supraclavicular, and axillary nodes normal  Neurologic:  CNII-XII intact, normal strength, sensation and gait; reflex          Psych:   Normal mood, affect, hygiene and grooming.       Assessment & Plan:  Routine general medical examination at a health care facility - Plan: Comprehensive metabolic panel, CBC with Differential/Platelet, TSH, T4, free, Lipid panel -Here today for fasting CPE.  Preventive health care reviewed.  She did not get her screening colonoscopy but is willing to do this.  GI referral made.  I recommend regular dental and eye exams.   Counseling on healthy lifestyle including cutting back on calories and increasing physical activity.  Immunizations reviewed.  Advise she should contact her insurance company prior to getting the Shingrix vaccine to find out her financial responsibility.  Discussed safety and health promotion.   Need for influenza vaccination - Plan: Flu Vaccine QUAD 6+ mos PF IM (Fluarix Quad PF) -Discussed potential side effects.  Need for COVID-19 vaccine - Plan: Pension scheme manager -Discussed potential side effects.  Prediabetes - Plan: Hemoglobin A1c -Encouraged cutting back on portion sizes, carbohydrates and increasing physical activity.  Follow-up pending A1c result  Primary hypertension - Plan: Comprehensive metabolic panel, CBC with Differential/Platelet -Blood pressure fairly well controlled.  I recommend she start checking her blood pressure at home periodically.  Recommend she stop using salt to cook with.  Try herbs.  Increase physical activity.  Continue current medication.  Obesity (BMI 30-39.9) - Plan: TSH, T4, free, Hemoglobin A1c, Lipid panel -In-depth counseling on ways to cut back calories during her day.  Discussed intermittent fasting.  Discussed eating on a smaller dinner plate and waiting 10 to 15 minutes before getting more food.  Encouraged to eliminate be glass of wine or at least drink a smaller portion in the evening.  Encouraged her not to drink calories.  Recommend 150 minutes of physical activity each week.  Hyperlipidemia, unspecified hyperlipidemia type - Plan: Lipid panel -Continue statin therapy.  If her LDL is not well controlled, she is fine with me switching her to Lipitor or Crestor.  Recommend low-fat diet.  Elevated ALT measurement - Plan: Comprehensive metabolic panel -Discussed consistently elevated ALT.  Advised that if her liver function is still abnormal that I will send her for an ultrasound and that she should also discuss this with GI when  she is there for her visit.  Discussed that she may have fatty liver disease and the long-term consequences.  Recommend weight loss.  Screen for colon cancer - Plan: Ambulatory referral to Gastroenterology -Her mother had colon cancer.  She is aware that she is high risk for colon cancer.  She did not get her colonoscopy done last year as recommended.  GI referral made and she reports she is willing to get this done.  Family history of colon cancer in mother - Plan: Ambulatory referral to Gastroenterology  Multiple atypical nevi -I recommend she call and schedule with her dermatologist and she is agreeable.

## 2021-03-09 NOTE — Patient Instructions (Addendum)
Call and schedule a visit with your dermatologist and dentist.  You will hear from [facility] gastroenterology to schedule a visit.  Cut back on portion sizes and calories.  Avoid drinking calories at dinner.  Avoid alcohol or at least reduce the amount.  Try to get at least 150 minutes of physical activity each week.  You qualify for the Shingrix vaccine but check with your insurance before getting it.    Start checking her blood pressure at home.  If it is consistently higher than 130/80, let me know.  Preventive Care 64-39 Years Old, Female Preventive care refers to lifestyle choices and visits with your health care provider that can promote health and wellness. This includes: A yearly physical exam. This is also called an annual wellness visit. Regular dental and eye exams. Immunizations. Screening for certain conditions. Healthy lifestyle choices, such as: Eating a healthy diet. Getting regular exercise. Not using drugs or products that contain nicotine and tobacco. Limiting alcohol use. What can I expect for my preventive care visit? Physical exam Your health care provider will check your: Height and weight. These may be used to calculate your BMI (body mass index). BMI is a measurement that tells if you are at a healthy weight. Heart rate and blood pressure. Body temperature. Skin for abnormal spots. Counseling Your health care provider may ask you questions about your: Past medical problems. Family's medical history. Alcohol, tobacco, and drug use. Emotional well-being. Home life and relationship well-being. Sexual activity. Diet, exercise, and sleep habits. Work and work Statistician. Access to firearms. Method of birth control. Menstrual cycle. Pregnancy history. What immunizations do I need? Vaccines are usually given at various ages, according to a schedule. Your health care provider will recommend vaccines for you based on your age, medical history, and  lifestyle or other factors, such as travel or where you work. What tests do I need? Blood tests Lipid and cholesterol levels. These may be checked every 5 years, or more often if you are over 29 years old. Hepatitis C test. Hepatitis B test. Screening Lung cancer screening. You may have this screening every year starting at age 63 if you have a 30-pack-year history of smoking and currently smoke or have quit within the past 15 years. Colorectal cancer screening. All adults should have this screening starting at age 47 and continuing until age 32. Your health care provider may recommend screening at age 89 if you are at increased risk. You will have tests every 1-10 years, depending on your results and the type of screening test. Diabetes screening. This is done by checking your blood sugar (glucose) after you have not eaten for a while (fasting). You may have this done every 1-3 years. Mammogram. This may be done every 1-2 years. Talk with your health care provider about when you should start having regular mammograms. This may depend on whether you have a family history of breast cancer. BRCA-related cancer screening. This may be done if you have a family history of breast, ovarian, tubal, or peritoneal cancers. Pelvic exam and Pap test. This may be done every 3 years starting at age 2. Starting at age 48, this may be done every 5 years if you have a Pap test in combination with an HPV test. Other tests STD (sexually transmitted disease) testing, if you are at risk. Bone density scan. This is done to screen for osteoporosis. You may have this scan if you are at high risk for osteoporosis. Talk with your health care  provider about your test results, treatment options, and if necessary, the need for more tests. Follow these instructions at home: Eating and drinking  Eat a diet that includes fresh fruits and vegetables, whole grains, lean protein, and low-fat dairy products. Take vitamin  and mineral supplements as recommended by your health care provider. Do not drink alcohol if: Your health care provider tells you not to drink. You are pregnant, may be pregnant, or are planning to become pregnant. If you drink alcohol: Limit how much you have to 0-1 drink a day. Be aware of how much alcohol is in your drink. In the [place] one drink equals one 12 oz bottle of beer (355 mL), one 5 oz glass of wine (148 mL), or one 1 oz glass of hard liquor (44 mL). Lifestyle Take daily care of your teeth and gums. Brush your teeth every morning and night with fluoride toothpaste. Floss one time each day. Stay active. Exercise for at least 30 minutes 5 or more days each week. Do not use any products that contain nicotine or tobacco, such as cigarettes, e-cigarettes, and chewing tobacco. If you need help quitting, ask your health care provider. Do not use drugs. If you are sexually active, practice safe sex. Use a condom or other form of protection to prevent STIs (sexually transmitted infections). If you do not wish to become pregnant, use a form of birth control. If you plan to become pregnant, see your health care provider for a prepregnancy visit. If told by your health care provider, take low-dose aspirin daily starting at age 46. Find healthy ways to cope with stress, such as: Meditation, yoga, or listening to music. Journaling. Talking to a trusted person. Spending time with friends and family. Safety Always wear your seat belt while driving or riding in a vehicle. Do not drive: If you have been drinking alcohol. Do not ride with someone who has been drinking. When you are tired or distracted. While texting. Wear a helmet and other protective equipment during sports activities. If you have firearms in your house, make sure you follow all gun safety procedures. What's next? Visit your health care provider once a year for an annual wellness visit. Ask your health care provider how  often you should have your eyes and teeth checked. Stay up to date on all vaccines. This information is not intended to replace advice given to you by your health care provider. Make sure you discuss any questions you have with your health care provider. Document Revised: 08/08/2020 Document Reviewed: 02/09/2018 Elsevier Patient Education  2022 [employer].

## 2021-03-10 LAB — CBC WITH DIFFERENTIAL/PLATELET
Basophils Absolute: 0 10*3/uL (ref 0.0–0.2)
Basos: 1 %
EOS (ABSOLUTE): 0.1 10*3/uL (ref 0.0–0.4)
Eos: 2 %
Hematocrit: 44.2 % (ref 34.0–46.6)
Hemoglobin: 14.8 g/dL (ref 11.1–15.9)
Immature Grans (Abs): 0 10*3/uL (ref 0.0–0.1)
Immature Granulocytes: 0 %
Lymphocytes Absolute: 2.3 10*3/uL (ref 0.7–3.1)
Lymphs: 41 %
MCH: 30.3 pg (ref 26.6–33.0)
MCHC: 33.5 g/dL (ref 31.5–35.7)
MCV: 90 fL (ref 79–97)
Monocytes Absolute: 0.4 10*3/uL (ref 0.1–0.9)
Monocytes: 8 %
Neutrophils Absolute: 2.7 10*3/uL (ref 1.4–7.0)
Neutrophils: 48 %
Platelets: 285 10*3/uL (ref 150–450)
RBC: 4.89 x10E6/uL (ref 3.77–5.28)
RDW: 12.1 % (ref 11.7–15.4)
WBC: 5.6 10*3/uL (ref 3.4–10.8)

## 2021-03-10 LAB — COMPREHENSIVE METABOLIC PANEL
ALT: 42 IU/L — ABNORMAL HIGH (ref 0–32)
AST: 26 IU/L (ref 0–40)
Albumin/Globulin Ratio: 1.6 (ref 1.2–2.2)
Albumin: 4.7 g/dL (ref 3.8–4.8)
Alkaline Phosphatase: 59 IU/L (ref 44–121)
BUN/Creatinine Ratio: 20 (ref 12–28)
BUN: 17 mg/dL (ref 8–27)
Bilirubin Total: 0.4 mg/dL (ref 0.0–1.2)
CO2: 24 mmol/L (ref 20–29)
Calcium: 10.3 mg/dL (ref 8.7–10.3)
Chloride: 102 mmol/L (ref 96–106)
Creatinine, Ser: 0.87 mg/dL (ref 0.57–1.00)
Globulin, Total: 2.9 g/dL (ref 1.5–4.5)
Glucose: 118 mg/dL — ABNORMAL HIGH (ref 70–99)
Potassium: 4.2 mmol/L (ref 3.5–5.2)
Sodium: 142 mmol/L (ref 134–144)
Total Protein: 7.6 g/dL (ref 6.0–8.5)
eGFR: 75 mL/min/{1.73_m2} (ref >59)

## 2021-03-10 LAB — TSH: TSH: 1.95 u[IU]/mL (ref 0.450–4.500)

## 2021-03-10 LAB — HEMOGLOBIN A1C
Est. average glucose Bld gHb Est-mCnc: 146 mg/dL
Hgb A1c MFr Bld: 6.7 % — ABNORMAL HIGH (ref 4.8–5.6)

## 2021-03-10 LAB — LIPID PANEL
Chol/HDL Ratio: 3.2 ratio (ref 0.0–4.4)
Cholesterol, Total: 186 mg/dL (ref 100–199)
HDL: 59 mg/dL (ref >39)
LDL Chol Calc (NIH): 110 mg/dL — ABNORMAL HIGH (ref 0–99)
Triglycerides: 95 mg/dL (ref 0–149)
VLDL Cholesterol Cal: 17 mg/dL (ref 5–40)

## 2021-03-10 LAB — T4, FREE: Free T4: 1.08 ng/dL (ref 0.82–1.77)

## 2021-03-10 NOTE — Progress Notes (Signed)
See if Brittany Mooney can add on an acute hepatitis panel due to elevated ALT.  Also, please schedule her for an in office visit, 30 mins, to discuss now having diabetes and the treatment plan, we have options to discuss.

## 2021-03-11 LAB — HCV INTERPRETATION

## 2021-03-11 LAB — ACUTE VIRAL HEPATITIS (HAV, HBV, HCV)
HCV Ab: <0.1 s/co ratio (ref 0.0–0.9)
Hep A IgM: NEGATIVE
Hep B C IgM: NEGATIVE
Hepatitis B Surface Ag: NEGATIVE

## 2021-03-11 LAB — SPECIMEN STATUS REPORT

## 2021-03-19 NOTE — Progress Notes (Signed)
   Subjective:    Patient ID: Brittany Mooney, female    DOB: Feb 28, 1956, 65 y.o.   MRN: [id number]  HPI Chief Complaint  Patient presents with   other    F/u on labs no other issues    She is here to discuss new onset diabetes. Hgb A1c 6.7% last week.  Previously she was in the prediabetes range.  States she prefers to try getting her blood sugars under better control by diet and exercise.  Does not want to check her blood sugars or start on medication at this point. LDL is not in goal range and she is on pravastatin.  No fever, chills, dizziness, chest pain, palpitations, shortness of breath, abdominal pain, nausea, vomiting or diarrhea.  No urinary symptoms.  Review of Systems Pertinent positives and negatives in the history of present illness.     Objective:   Physical Exam BP 136/84   Pulse 60   Temp 98.3 F (36.8 C)   Wt 212 lb 9.6 oz (96.4 kg)   BMI 35.38 kg/m   Alert and oriented in no acute distress.  Respirations unlabored.  Normal speech and memory.      Assessment & Plan:  Diabetes mellitus, new onset ([place]) - Plan: Amb ref to Medical Nutrition Therapy-MNT, Microalbumin / creatinine urine ratio, CANCELED: Amb Ref to Medical Weight Management  Elevated ALT measurement  Primary hypertension  Pure hypercholesterolemia  In-depth counseling on the spectrum of diabetes and standards of care.  Options discussed including metformin, Ozempic and she declines.  She will try cutting back on carbohydrates and increasing physical activity.  She declines to check blood sugars at this time.  Referral made to nutritionist for help with her diet. Discussed scheduling a diabetic eye exam.  Urine microalbumin ordered today.  I will switch her statin from pravastatin to atorvastatin for better control. Losartan 25 mg prescribed to help with blood pressure as well as renal protection now that she has diabetes. She will follow-up in 3 to 4 months or sooner if needed.

## 2021-03-20 ENCOUNTER — Encounter: Payer: Self-pay | Admitting: Family Medicine

## 2021-03-20 ENCOUNTER — Other Ambulatory Visit: Payer: Self-pay

## 2021-03-20 ENCOUNTER — Ambulatory Visit: Payer: Managed Care, Other (non HMO) | Admitting: Family Medicine

## 2021-03-20 VITALS — BP 136/84 | HR 60 | Temp 98.3°F | Wt 212.6 lb

## 2021-03-20 DIAGNOSIS — I10 Essential (primary) hypertension: Secondary | ICD-10-CM | POA: Diagnosis not present

## 2021-03-20 DIAGNOSIS — E119 Type 2 diabetes mellitus without complications: Secondary | ICD-10-CM

## 2021-03-20 DIAGNOSIS — R7401 Elevation of levels of liver transaminase levels: Secondary | ICD-10-CM

## 2021-03-20 DIAGNOSIS — E7800 Pure hypercholesterolemia, unspecified: Secondary | ICD-10-CM | POA: Diagnosis not present

## 2021-03-20 MED ORDER — LOSARTAN POTASSIUM 25 MG PO TABS: 25.0000 mg | ORAL_TABLET | Freq: Every day | ORAL | 1 refills | Status: DC

## 2021-03-20 MED ORDER — ATORVASTATIN CALCIUM 10 MG PO TABS: 10.0000 mg | ORAL_TABLET | Freq: Every day | ORAL | 1 refills | Status: DC

## 2021-03-20 NOTE — Patient Instructions (Signed)
Stop pravastatin and start atorvastatin.   Start taking losartan. Continue your other medications.   Work on cutting back your carbohydrate and sugar intake.  Try to limit your meals to 45-60 carbs And snacks to 15 carbs  10 minute walks 2-3 times per day will help lower your blood sugar  If you have not heard from the diabetes nutritionist in 2 weeks, let us know    Diabetes Mellitus and Exercise Exercising regularly is important for overall health, especially for people who have diabetes mellitus. Exercising is not only about losing weight. It has many other health benefits, such as increasing muscle strength and bone density and reducing body fat and stress. This leads to improved fitness, flexibility, and endurance, all of which result in better overall health. What are the benefits of exercise if I have diabetes? Exercise has many benefits for people with diabetes. They include: Helping to lower and control blood sugar (glucose). Helping the body to respond better to the hormone insulin by improving insulin sensitivity. Reducing how much insulin the body needs. Lowering the risk for heart disease by: Lowering "bad" cholesterol and triglyceride levels. Increasing "good" cholesterol levels. Lowering blood pressure. Lowering blood glucose levels. What is my activity plan? Your health care provider or certified diabetes educator can help you make a plan for the type and frequency of exercise that works for you. This is called your activity plan. Be sure to: Get at least 150 minutes of medium-intensity or high-intensity exercise each week. Exercises may include brisk walking, biking, or water aerobics. Do stretching and strengthening exercises, such as yoga or weight lifting, at least 2 times a week. Spread out your activity over at least 3 days of the week. Get some form of physical activity each day. Do not go more than 2 days in a row without some kind of physical activity. Avoid  being inactive for more than 90 minutes at a time. Take frequent breaks to walk or stretch. Choose exercises or activities that you enjoy. Set realistic goals. Start slowly and gradually increase your exercise intensity over time. How do I manage my diabetes during exercise? Monitor your blood glucose Check your blood glucose before and after exercising. If your blood glucose is: 240 mg/dL (13.3 mmol/L) or higher before you exercise, check your urine for ketones. These are chemicals created by the liver. If you have ketones in your urine, do not exercise until your blood glucose returns to normal. 100 mg/dL (5.6 mmol/L) or lower, eat a snack containing 15-20 grams of carbohydrate. Check your blood glucose 15 minutes after the snack to make sure that your glucose level is above 100 mg/dL (5.6 mmol/L) before you start your exercise. Know the symptoms of low blood glucose (hypoglycemia) and how to treat it. Your risk for hypoglycemia increases during and after exercise. Follow these tips and your health care provider's instructions Keep a carbohydrate snack that is fast-acting for use before, during, and after exercise to help prevent or treat hypoglycemia. Avoid injecting insulin into areas of the body that are going to be exercised. For example, avoid injecting insulin into: Your arms, when you are about to play tennis. Your legs, when you are about to go jogging. Keep records of your exercise habits. Doing this can help you and your health care provider adjust your diabetes management plan as needed. Write down: Food that you eat before and after you exercise. Blood glucose levels before and after you exercise. The type and amount of exercise you  have done. Work with your health care provider when you start a new exercise or activity. He or she may need to: Make sure that the activity is safe for you. Adjust your insulin, other medicines, and food that you eat. Drink plenty of water while you  exercise. This prevents loss of water (dehydration) and problems caused by a lot of heat in the body (heat stroke). Where to find more information American Diabetes Association: www.diabetes.org Summary Exercising regularly is important for overall health, especially for people who have diabetes mellitus. Exercising has many health benefits. It increases muscle strength and bone density and reduces body fat and stress. It also lowers and controls blood glucose. Your health care provider or certified diabetes educator can help you make an activity plan for the type and frequency of exercise that works for you. Work with your health care provider to make sure any new activity is safe for you. Also work with your health care provider to adjust your insulin, other medicines, and the food you eat. This information is not intended to replace advice given to you by your health care provider. Make sure you discuss any questions you have with your health care provider. Document Revised: 02/26/2019 Document Reviewed: 02/26/2019 Elsevier Patient Education  [place].

## 2021-03-22 LAB — MICROALBUMIN / CREATININE URINE RATIO
Creatinine, Urine: 56.4 mg/dL
Microalb/Creat Ratio: <5 mg/g creat (ref 0–29)
Microalbumin, Urine: <3 ug/mL

## 2021-04-10 ENCOUNTER — Telehealth: Payer: Self-pay | Admitting: Family Medicine

## 2021-04-10 NOTE — Telephone Encounter (Signed)
Called pt concerning her recent ER visit at [facility] med center. Left a message advise pt to call back.

## 2021-05-21 ENCOUNTER — Encounter: Payer: Self-pay | Admitting: Gastroenterology

## 2021-06-09 ENCOUNTER — Other Ambulatory Visit: Payer: Self-pay

## 2021-06-09 MED ORDER — ATENOLOL 50 MG PO TABS: 50.0000 mg | ORAL_TABLET | Freq: Every day | ORAL | 0 refills | Status: DC

## 2021-06-09 MED ORDER — HYDROCHLOROTHIAZIDE 25 MG PO TABS: 25.0000 mg | ORAL_TABLET | Freq: Every day | ORAL | 0 refills | Status: DC

## 2021-07-28 ENCOUNTER — Other Ambulatory Visit: Payer: Self-pay

## 2021-07-28 ENCOUNTER — Ambulatory Visit (AMBULATORY_SURGERY_CENTER): Payer: Managed Care, Other (non HMO) | Admitting: *Deleted

## 2021-07-28 VITALS — Ht 65.0 in | Wt 212.0 lb

## 2021-07-28 DIAGNOSIS — Z1211 Encounter for screening for malignant neoplasm of colon: Secondary | ICD-10-CM

## 2021-07-28 MED ORDER — NA SULFATE-K SULFATE-MG SULF 17.5-3.13-1.6 GM/177ML PO SOLN: 1.0000 | ORAL | 0 refills | Status: DC

## 2021-07-28 NOTE — Progress Notes (Signed)

## 2021-08-07 ENCOUNTER — Encounter: Payer: Self-pay | Admitting: Gastroenterology

## 2021-08-11 ENCOUNTER — Encounter: Payer: Self-pay | Admitting: Gastroenterology

## 2021-08-11 ENCOUNTER — Ambulatory Visit (AMBULATORY_SURGERY_CENTER): Payer: Managed Care, Other (non HMO) | Admitting: Gastroenterology

## 2021-08-11 ENCOUNTER — Other Ambulatory Visit: Payer: Self-pay

## 2021-08-11 VITALS — BP 113/69 | HR 52 | Temp 96.8°F | Resp 14 | Ht 65.0 in | Wt 212.0 lb

## 2021-08-11 DIAGNOSIS — D122 Benign neoplasm of ascending colon: Secondary | ICD-10-CM

## 2021-08-11 DIAGNOSIS — Z1211 Encounter for screening for malignant neoplasm of colon: Secondary | ICD-10-CM

## 2021-08-11 DIAGNOSIS — D120 Benign neoplasm of cecum: Secondary | ICD-10-CM | POA: Diagnosis not present

## 2021-08-11 MED ORDER — SODIUM CHLORIDE 0.9 % IV SOLN
500.0000 mL | INTRAVENOUS | Status: AC
Start: 2021-08-11 — End: ?

## 2021-08-11 NOTE — Progress Notes (Signed)
Called to room to assist during endoscopic procedure.  Patient ID and intended procedure confirmed with present staff. Received instructions for my participation in the procedure from the performing physician.  

## 2021-08-11 NOTE — Op Note (Signed)
[place] Patient Name: Brittany Mooney Procedure Date: 08/11/2021 9:31 AM MRN: [id number] Endoscopist: Thornton Park MD, MD Age: 66 Referring MD:  Date of Birth: [birth year] Gender: Female Account #: [id number] Procedure:                Colonoscopy Indications:              Screening for colorectal malignant neoplasm, This                            is the patient's first colonoscopy                           Mother with colon cancer in her 47s Medicines:                Monitored Anesthesia Care Procedure:                Pre-Anesthesia Assessment:                           - Prior to the procedure, a History and Physical                            was performed, and patient medications and                            allergies were reviewed. The patient's tolerance of                            previous anesthesia was also reviewed. The risks                            and benefits of the procedure and the sedation                            options and risks were discussed with the patient.                            All questions were answered, and informed consent                            was obtained. Prior Anticoagulants: The patient has                            taken no previous anticoagulant or antiplatelet                            agents. ASA Grade Assessment: II - A patient with                            mild systemic disease. After reviewing the risks                            and benefits, the patient was deemed in  satisfactory condition to undergo the procedure.                           After obtaining informed consent, the colonoscope                            was passed under direct vision. Throughout the                            procedure, the patient's blood pressure, pulse, and                            oxygen saturations were monitored continuously. The                            CF [id number] #[id number] was introduced  through the anus                            and advanced to the 3 cm into the ileum. A second                            forward view of the right colon was performed. The                            colonoscopy was performed without difficulty. The                            patient tolerated the procedure well. The quality                            of the bowel preparation was good. The terminal                            ileum, ileocecal valve, appendiceal orifice, and                            rectum were photographed. Scope In: 9:39:56 AM Scope Out: 9:50:38 AM Scope Withdrawal Time: 0 hours 8 minutes 41 seconds  Total Procedure Duration: 0 hours 10 minutes 42 seconds  Findings:                 The perianal and digital rectal examinations were                            normal.                           A few small and large-mouthed diverticula were                            found in the sigmoid colon and descending colon.                           Two sessile polyps were found in the ascending  colon. The polyps were 2 to 4 mm in size. These                            polyps were removed with a cold snare. Resection                            and retrieval were complete. Estimated blood loss                            was minimal.                           A 8 mm polyp was found in the cecum. The polyp was                            sessile. The polyp was removed with a cold snare.                            Resection and retrieval were complete. Estimated                            blood loss was minimal.                           The exam was otherwise without abnormality on                            direct and retroflexion views. Complications:            No immediate complications. Estimated blood loss:                            Minimal. Estimated Blood Loss:     Estimated blood loss was minimal. Impression:               - Diverticulosis in the  sigmoid colon and in the                            descending colon.                           - Two 2 to 4 mm polyps in the ascending colon,                            removed with a cold snare. Resected and retrieved.                           - One 8 mm polyp in the cecum, removed with a cold                            snare. Resected and retrieved.                           - The examination was otherwise normal on direct  and retroflexion views. Recommendation:           - Patient has a contact number available for                            emergencies. The signs and symptoms of potential                            delayed complications were discussed with the                            patient. Return to normal activities tomorrow.                            Written discharge instructions were provided to the                            patient.                           - Continue present medications.                           - Await pathology results.                           - Repeat colonoscopy date to be determined after                            pending pathology results are reviewed for                            surveillance.                           - Emerging evidence supports eating a diet of                            fruits, vegetables, grains, calcium, and yogurt                            while reducing red meat and alcohol may reduce the                            risk of colon cancer.                           - Follow a high fiber diet. Drink at least 64                            ounces of water daily. Add a daily stool bulking                            agent such as psyllium (an exampled would be  Metamucil).                           - Thank you for allowing me to be involved in your                            colon cancer prevention. Thornton Park MD, MD 08/11/2021 9:55:16 AM This report has been  signed electronically.

## 2021-08-11 NOTE — Progress Notes (Signed)
Referring Provider: Davy Pique Primary Care Physician:  Girtha Rm, PA-C (Inactive)  Reason for Procedure:  Colon cancer screening   IMPRESSION:  Need for colon cancer screening Mother with colon cancer Appropriate candidate for monitored anesthesia care  PLAN: Colonoscopy in the [place] today   HPI: Brittany Mooney is a 66 y.o. female presents for screening colonoscopy.  No prior colonoscopy or colon cancer screening.  No baseline GI symptoms.   Mother with colon cancer in her 32s. No other known family history of colon cancer or polyps. No family history of uterine/endometrial cancer, pancreatic cancer or gastric/stomach cancer.   Past Medical History:  Diagnosis Date   Diabetes mellitus without complication ([place])    controlling with diet   Elevated ALT measurement    Hyperlipidemia    Hypertension    Prediabetes     Past Surgical History:  Procedure Laterality Date   CATARACT EXTRACTION  2021   INDUCED ABORTION     PARTIAL HYSTERECTOMY     TUBAL LIGATION      Current Outpatient Medications  Medication Sig Dispense Refill   atenolol (TENORMIN) 50 MG tablet Take 1 tablet (50 mg total) by mouth daily. 90 tablet 0   atorvastatin (LIPITOR) 10 MG tablet Take 1 tablet (10 mg total) by mouth daily. 90 tablet 1   diphenhydrAMINE (BENADRYL) 25 MG tablet Take 25 mg by mouth every 6 (six) hours as needed.     hydrochlorothiazide (HYDRODIURIL) 25 MG tablet Take 1 tablet (25 mg total) by mouth daily. 90 tablet 0   losartan (COZAAR) 25 MG tablet Take 1 tablet (25 mg total) by mouth daily. 90 tablet 1   Current Facility-Administered Medications  Medication Dose Route Frequency Provider Last Rate Last Admin   0.9 %  sodium chloride infusion  500 mL Intravenous Continuous Thornton Park, MD        Allergies as of 08/11/2021   (No Known Allergies)    Family History  Problem Relation Age of Onset   Cancer Mother    Breast cancer Mother     Hypertension Mother    Rheum arthritis Mother    Thyroid disease Mother    Vaginal cancer Mother    Colon cancer Mother 23   Lung cancer Father 26   Colon polyps Brother    Heart disease Brother    Colitis Brother    COPD Brother    Diabetes Brother    Breast cancer Maternal Aunt    Alcoholism Daughter    Hepatitis C Daughter    Alcoholism Son    Breast cancer Cousin    Esophageal cancer Neg Hx    Rectal cancer Neg Hx    Stomach cancer Neg Hx      Physical Exam: General:   Alert,  well-nourished, pleasant and cooperative in NAD Head:  Normocephalic and atraumatic. Eyes:  Sclera clear, no icterus.   Conjunctiva pink. Mouth:  No deformity or lesions.   Neck:  Supple; no masses or thyromegaly. Lungs:  Clear throughout to auscultation.   No wheezes. Heart:  Regular rate and rhythm; no murmurs. Abdomen:  Soft, non-tender, nondistended, normal bowel sounds, no rebound or guarding.  Msk:  Symmetrical. No boney deformities LAD: No inguinal or umbilical LAD Extremities:  No clubbing or edema. Neurologic:  Alert and  oriented x4;  grossly nonfocal Skin:  No obvious rash or bruise. Psych:  Alert and cooperative. Normal mood and affect.     Studies/Results: No results found.  Timouthy Gilardi L. Tarri Glenn, MD, MPH 08/11/2021, 9:31 AM

## 2021-08-11 NOTE — Patient Instructions (Signed)
Information on polyps and diverticulosis given to you.  Await pathology results.  Resume previous diet and medications.   YOU HAD AN ENDOSCOPIC PROCEDURE TODAY AT [facility]:   Refer to the procedure report that was given to you for any specific questions about what was found during the examination.  If the procedure report does not answer your questions, please call your gastroenterologist to clarify.  If you requested that your care partner not be given the details of your procedure findings, then the procedure report has been included in a sealed envelope for you to review at your convenience later.  YOU SHOULD EXPECT: Some feelings of bloating in the abdomen. Passage of more gas than usual.  Walking can help get rid of the air that was put into your GI tract during the procedure and reduce the bloating. If you had a lower endoscopy (such as a colonoscopy or flexible sigmoidoscopy) you may notice spotting of blood in your stool or on the toilet paper. If you underwent a bowel prep for your procedure, you may not have a normal bowel movement for a few days.  Please Note:  You might notice some irritation and congestion in your nose or some drainage.  This is from the oxygen used during your procedure.  There is no need for concern and it should clear up in a day or so.  SYMPTOMS TO REPORT IMMEDIATELY:  Following lower endoscopy (colonoscopy or flexible sigmoidoscopy):  Excessive amounts of blood in the stool  Significant tenderness or worsening of abdominal pains  Swelling of the abdomen that is new, acute  Fever of 100F or higher  For urgent or emergent issues, a gastroenterologist can be reached at any hour by calling [phone]. Do not use MyChart messaging for urgent concerns.    DIET:  We do recommend a small meal at first, but then you may proceed to your regular diet.  Drink plenty of fluids but you should avoid alcoholic beverages for 24 hours.  ACTIVITY:   You should plan to take it easy for the rest of today and you should NOT DRIVE or use heavy machinery until tomorrow (because of the sedation medicines used during the test).    FOLLOW UP: Our staff will call the number listed on your records 48-72 hours following your procedure to check on you and address any questions or concerns that you may have regarding the information given to you following your procedure. If we do not reach you, we will leave a message.  We will attempt to reach you two times.  During this call, we will ask if you have developed any symptoms of COVID 19. If you develop any symptoms (ie: fever, flu-like symptoms, shortness of breath, cough etc.) before then, please call [phone].  If you test positive for Covid 19 in the 2 weeks post procedure, please call and report this information to [place].    If any biopsies were taken you will be contacted by phone or by letter within the next 1-3 weeks.  Please call us at [phone] if you have not heard about the biopsies in 3 weeks.    SIGNATURES/CONFIDENTIALITY: You and/or your care partner have signed paperwork which will be entered into your electronic medical record.  These signatures attest to the fact that that the information above on your After Visit Summary has been reviewed and is understood.  Full responsibility of the confidentiality of this discharge information lies with you and/or your care-partner.

## 2021-08-11 NOTE — Progress Notes (Signed)
PT taken to PACU. Monitors in place. VSS. Report given to RN. 

## 2021-08-11 NOTE — Progress Notes (Signed)
VS- Brittany Mooney  Pt's states no medical or surgical changes since previsit or office visit.  

## 2021-08-13 ENCOUNTER — Encounter: Payer: Self-pay | Admitting: Gastroenterology

## 2021-08-13 ENCOUNTER — Telehealth: Payer: Self-pay

## 2021-08-13 NOTE — Telephone Encounter (Signed)
?  Follow up Call- ? ?Call back number 08/11/2021  ?Post procedure Call Back phone  # [phone]  ?Permission to leave phone message Yes  ?Some recent data might be hidden  ?  ? ?Patient questions: ? ?Do you have a fever, pain , or abdominal swelling? No. ?Pain Score  0 * ? ?Have you tolerated food without any problems? Yes.   ? ?Have you been able to return to your normal activities? Yes.   ? ?Do you have any questions about your discharge instructions: ?Diet   No. ?Medications  No. ?Follow up visit  No. ? ?Do you have questions or concerns about your Care? No. ? ?Actions: ?* If pain score is 4 or above: ?No action needed, pain <4. ? ?Have you developed a fever since your procedure? no ? ?2.   Have you had an respiratory symptoms (SOB or cough) since your procedure? no ? ?3.   Have you tested positive for COVID 19 since your procedure no ? ?4.   Have you had any family members/close contacts diagnosed with the COVID 19 since your procedure?  no ? ? ?If yes to any of these questions please route to Joylene John, RN and Joella Prince, RN  ? ? ?

## 2021-09-03 ENCOUNTER — Ambulatory Visit: Payer: Managed Care, Other (non HMO) | Admitting: Physician Assistant

## 2021-09-03 ENCOUNTER — Encounter: Payer: Self-pay | Admitting: Physician Assistant

## 2021-09-03 ENCOUNTER — Other Ambulatory Visit: Payer: Self-pay

## 2021-09-03 VITALS — BP 110/70 | HR 49 | Ht 65.0 in | Wt 198.6 lb

## 2021-09-03 DIAGNOSIS — E1165 Type 2 diabetes mellitus with hyperglycemia: Secondary | ICD-10-CM

## 2021-09-03 DIAGNOSIS — M79671 Pain in right foot: Secondary | ICD-10-CM | POA: Diagnosis not present

## 2021-09-03 DIAGNOSIS — M79672 Pain in left foot: Secondary | ICD-10-CM

## 2021-09-03 DIAGNOSIS — R200 Anesthesia of skin: Secondary | ICD-10-CM

## 2021-09-03 DIAGNOSIS — M25531 Pain in right wrist: Secondary | ICD-10-CM

## 2021-09-03 DIAGNOSIS — I10 Essential (primary) hypertension: Secondary | ICD-10-CM

## 2021-09-03 DIAGNOSIS — E1169 Type 2 diabetes mellitus with other specified complication: Secondary | ICD-10-CM | POA: Insufficient documentation

## 2021-09-03 DIAGNOSIS — E782 Mixed hyperlipidemia: Secondary | ICD-10-CM | POA: Insufficient documentation

## 2021-09-03 DIAGNOSIS — R202 Paresthesia of skin: Secondary | ICD-10-CM

## 2021-09-03 NOTE — Assessment & Plan Note (Signed)
controlled, eat a low salt diet, do not add any salt to food when cooking, avoid processed foods, avoid fried foods ? ?

## 2021-09-03 NOTE — Progress Notes (Signed)
? ?Acute Office Visit ? ?Subjective:  ? ? Patient ID: Brittany Mooney, female    DOB: [birth year], 66 y.o.   MRN: [id number] ? ?Chief Complaint  ?Patient presents with  ? Foot Pain  ?  Pt is having pain in both feet. Also has pain in her right hand.  ? Hand Pain  ? ? ?HPI ?Patient is in today for a follow up appointment. ? ?Reports intermittent right wrist pain and pain at the base of thumb for about 6 months to a year, has pain and weakness in the area and states it's difficult to pick up a cup of coffee sometimes, OTC wrist brace is helpful; states she doesn't want x-rays or to go see a hand specialist/Orthopedic referral at this time; hasn't been taking any pain medicine for this; also reports occasional numbness in her hands; right hand dominant. ? ?Also reports intermittent bilateral foot pain x about 1 year; right worse than left; 1st steps in the morning are painful all through her feet, although she states it's not sore to touch at any time; sometimes sore when flexing toes; after she gets up and moving around doesn't hurt any more; hasn't taken OTC pain medicine because itsn't not that painful; states the pain does interfere with her trying to intentionally walk for exercise. ? ?Outpatient Medications Prior to Visit  ?Medication Sig Dispense Refill  ? atenolol (TENORMIN) 50 MG tablet Take 1 tablet (50 mg total) by mouth daily. 90 tablet 0  ? atorvastatin (LIPITOR) 10 MG tablet Take 1 tablet (10 mg total) by mouth daily. 90 tablet 1  ? diphenhydrAMINE (BENADRYL) 25 MG tablet Take 25 mg by mouth every 6 (six) hours as needed.    ? hydrochlorothiazide (HYDRODIURIL) 25 MG tablet Take 1 tablet (25 mg total) by mouth daily. 90 tablet 0  ? losartan (COZAAR) 25 MG tablet Take 1 tablet (25 mg total) by mouth daily. 90 tablet 1  ? ?Facility-Administered Medications Prior to Visit  ?Medication Dose Route Frequency Provider Last Rate Last Admin  ? 0.9 %  sodium chloride infusion  500 mL Intravenous Continuous  Thornton Park, MD      ? ? ?No Known Allergies ? ?Review of Systems  ?Constitutional:  Negative for activity change and chills.  ?HENT:  Negative for congestion and voice change.   ?Eyes:  Negative for pain and redness.  ?Respiratory:  Negative for cough and wheezing.   ?Cardiovascular:  Negative for chest pain.  ?Gastrointestinal:  Negative for constipation, diarrhea, nausea and vomiting.  ?Endocrine: Negative for polyuria.  ?Genitourinary:  Negative for frequency.  ?Musculoskeletal:  Positive for arthralgias and myalgias.  ?Skin:  Negative for color change and rash.  ?Allergic/Immunologic: Negative for immunocompromised state.  ?Neurological:  Positive for numbness. Negative for dizziness.  ?Psychiatric/Behavioral:  Negative for agitation.   ? ?   ?Objective:  ?  ?Physical Exam ?Vitals and nursing note reviewed.  ?Constitutional:   ?   General: She is not in acute distress. ?   Appearance: Normal appearance. She is not ill-appearing.  ?HENT:  ?   Head: Normocephalic and atraumatic.  ?   Right Ear: External ear normal.  ?   Left Ear: External ear normal.  ?   Nose: No congestion.  ?Eyes:  ?   Extraocular Movements: Extraocular movements intact.  ?   Conjunctiva/sclera: Conjunctivae normal.  ?   Pupils: Pupils are equal, round, and reactive to light.  ?Cardiovascular:  ?   Rate and Rhythm: Normal rate  and regular rhythm.  ?   Pulses: Normal pulses.  ?   Heart sounds: Normal heart sounds.  ?Pulmonary:  ?   Effort: Pulmonary effort is normal.  ?   Breath sounds: Normal breath sounds. No wheezing.  ?Abdominal:  ?   General: Bowel sounds are normal.  ?   Palpations: Abdomen is soft.  ?Musculoskeletal:     ?   General: Normal range of motion.  ?   Right wrist: No swelling or tenderness. Normal range of motion. Normal pulse.  ?   Left wrist: Normal. Normal pulse.  ?   Cervical back: Normal range of motion and neck supple.  ?   Right lower leg: No edema.  ?   Left lower leg: No edema.  ?   Right foot: Normal. Normal  range of motion. Normal pulse.  ?   Left foot: Normal. Normal range of motion. Normal pulse.  ?   Comments: No pain elicited, 2+ DP, PT pulses; mildly positive Tinel's and Phalen's in bilateral upper extremities  ?Skin: ?   General: Skin is warm and dry.  ?   Findings: No bruising.  ?Neurological:  ?   General: No focal deficit present.  ?   Mental Status: She is alert and oriented to person, place, and time.  ?Psychiatric:     ?   Mood and Affect: Mood normal.     ?   Behavior: Behavior normal.     ?   Thought Content: Thought content normal.  ? ? ?BP 110/70   Pulse (!) 49   Wt 198 lb 9.6 oz (90.1 kg)   SpO2 99%   BMI 33.05 kg/m?  ? ?Wt Readings from Last 3 Encounters:  ?09/03/21 198 lb 9.6 oz (90.1 kg)  ?08/11/21 212 lb (96.2 kg)  ?07/28/21 212 lb (96.2 kg)  ? ? ?Results for orders placed or performed in visit on 03/20/21  ?Microalbumin / creatinine urine ratio  ?Result Value Ref Range  ? Creatinine, Urine 56.4 Not Estab. mg/dL  ? Microalbumin, Urine <3.0 Not Estab. ug/mL  ? Microalb/Creat Ratio <5 0 - 29 mg/g creat  ? ? ?   ?Assessment & Plan:  ?1. Bilateral foot pain ?- DG Foot Complete Left; Future ?- DG Foot Complete Right; Future ?- AMB referral to orthopedics ? ?2. 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 ?3. Numbness and tingling in right hand ?- mild, will monitor and refer to Neurology for bilateral upper extremity EMG/NCS if worse ? ?4. Type 2 diabetes mellitus with hyperglycemia, without long-term current use of insulin ([place]) ? ?5. Essential hypertension ? ? ? ?No orders of the defined types were placed in this encounter. ? ? ?Return in about 2 months (around 11/03/2021) for Return for Follow Up Exam. ? ?Irene Pap, PA-C ?

## 2021-09-03 NOTE — Patient Instructions (Signed)
You will get a call to schedule an appointment with Orthopedics - Foot and Ankle. ? ?You can walk in to get bilateral foot x-rays: ?Diagnostic Radiology and Imaging ? ?[facility] ?[facility] ?[address] [facility]) ?[address] ?[place], [place] [address] ? ?Phone [phone] ?Fax [phone] ? ?Hours of Operation: Monday - Friday, 8 am-5 pm  ? ? ? ? ?

## 2021-09-03 NOTE — Assessment & Plan Note (Signed)
controlled, eat a low sugar diet, avoid starchy food with a lot of carbohydrates, avoid fried and processed foods; last hgb a1c 6.7 on 03/11/2021 ?

## 2021-09-06 ENCOUNTER — Other Ambulatory Visit: Payer: Self-pay | Admitting: Medical

## 2021-09-06 ENCOUNTER — Other Ambulatory Visit: Payer: Self-pay | Admitting: Family Medicine

## 2021-09-07 ENCOUNTER — Other Ambulatory Visit: Payer: Self-pay | Admitting: Physician Assistant

## 2021-09-07 MED ORDER — ATORVASTATIN CALCIUM 10 MG PO TABS: 10.0000 mg | ORAL_TABLET | Freq: Every day | ORAL | 1 refills | Status: DC

## 2021-09-10 ENCOUNTER — Ambulatory Visit: Payer: Managed Care, Other (non HMO) | Admitting: Orthopedic Surgery

## 2021-09-10 DIAGNOSIS — M6701 Short Achilles tendon (acquired), right ankle: Secondary | ICD-10-CM

## 2021-09-10 DIAGNOSIS — M6702 Short Achilles tendon (acquired), left ankle: Secondary | ICD-10-CM | POA: Diagnosis not present

## 2021-09-10 DIAGNOSIS — M79672 Pain in left foot: Secondary | ICD-10-CM | POA: Diagnosis not present

## 2021-09-10 DIAGNOSIS — M79671 Pain in right foot: Secondary | ICD-10-CM

## 2021-09-15 ENCOUNTER — Encounter: Payer: Self-pay | Admitting: Orthopedic Surgery

## 2021-09-15 NOTE — Progress Notes (Signed)
? ?Office Visit Note ?  ?Patient: Brittany Mooney           ?Date of Birth: [birth year]           ?MRN: [id number] ?Visit Date: 09/10/2021 ?             ?Requested by: Irene Pap, PA-C ?[address] ?[place],  [place] [address] ?PCP: Irene Pap, PA-C ? ?Chief Complaint  ?Patient presents with  ? Left Foot - Pain  ? Right Foot - Pain  ? ? ? ? ?HPI: ?Patient is a 66 year old woman who was seen for initial evaluation for bilateral foot pain.  Patient states pain is worse on the right than the left foot worse with start up in the morning gets better with walking.  Patient states the most pain is beneath the ball of her foot.  Past medical history significant for recent diagnosis of diabetes. ? ?Assessment & Plan: ?Visit Diagnoses:  ?1. Achilles tendon contracture, bilateral   ?2. Foot pain, bilateral   ? ? ?Plan: Discussed with the patient she has Achilles contracture secondary to decreased elasticity from the diabetes.  She was given instructions and demonstrated Achilles stretching to do 5 times a day a minute at a time.  Discussed that if this does not improve with stretching a gastrocnemius recession is an option. ? ?Follow-Up Instructions: Return if symptoms worsen or fail to improve.  ? ?Ortho Exam ? ?Patient is alert, oriented, no adenopathy, well-dressed, normal affect, normal respiratory effort. ?Examination patient has palpable dorsalis pedis pulses.  She has good ankle and subtalar motion.  With her knee extended she has significant Achilles contracture with dorsiflexion 30 degrees short of neutral bilaterally.  Patient has pain to palpation beneath the metatarsal heads which appear to be overloaded from the Achilles contracture.  Recent hemoglobin A1c is 6.7. ? ?Imaging: ?No results found. ?No images are attached to the encounter. ? ?Labs: ?Lab Results  ?Component Value Date  ? HGBA1C 6.7 (H) 03/09/2021  ? HGBA1C 6.2 (H) 12/06/2019  ? HGBA1C 6.0 (H) 10/26/2018  ? ? ? ?Lab Results   ?Component Value Date  ? ALBUMIN 4.7 03/09/2021  ? ALBUMIN 4.7 12/06/2019  ? ALBUMIN 4.6 10/26/2018  ? ? ?No results found for: MG ?No results found for: VD25OH ? ?No results found for: PREALBUMIN ? ?  Latest Ref Rng & Units 03/09/2021  ? 11:10 AM 12/06/2019  ?  9:52 AM 10/26/2018  ?  9:40 AM  ?CBC EXTENDED  ?WBC 3.4 - 10.8 x10E3/uL 5.6   5.8   5.5    ?RBC 3.77 - 5.28 x10E6/uL 4.89   4.99   4.75    ?Hemoglobin 11.1 - 15.9 g/dL 14.8   15.3   14.4    ?HCT 34.0 - 46.6 % 44.2   46.2   41.9    ?Platelets 150 - 450 x10E3/uL 285   298   280    ?NEUT# 1.4 - 7.0 x10E3/uL 2.7   3.1   2.8    ?Lymph# 0.7 - 3.1 x10E3/uL 2.3   2.0   2.1    ? ? ? ?There is no height or weight on file to calculate BMI. ? ?Orders:  ?No orders of the defined types were placed in this encounter. ? ?No orders of the defined types were placed in this encounter. ? ? ? Procedures: ?No procedures performed ? ?Clinical Data: ?No additional findings. ? ?ROS: ? ?All other systems negative, except as noted in the HPI. ?  Review of Systems ? ?Objective: ?Vital Signs: There were no vitals taken for this visit. ? ?Specialty Comments:  ?No specialty comments available. ? ?PMFS History: ?Patient Active Problem List  ? Diagnosis Date Noted  ? Essential hypertension 09/03/2021  ? Mixed hyperlipidemia 09/03/2021  ? Type 2 diabetes mellitus with hyperglycemia, without long-term current use of insulin ([place]) 09/03/2021  ? Estrogen deficiency 12/06/2019  ? Obesity (BMI 30-39.9) 12/06/2019  ? Prediabetes   ? Elevated ALT measurement   ? ?Past Medical History:  ?Diagnosis Date  ? Diabetes mellitus without complication ([place])   ? controlling with diet  ? Elevated ALT measurement   ? Hyperlipidemia   ? Hypertension   ? Prediabetes   ?  ?Family History  ?Problem Relation Age of Onset  ? Cancer Mother   ? Breast cancer Mother   ? Hypertension Mother   ? Rheum arthritis Mother   ? Thyroid disease Mother   ? Vaginal cancer Mother   ? Colon cancer Mother 16  ? Lung cancer Father 66  ?  Colon polyps Brother   ? Heart disease Brother   ? Colitis Brother   ? COPD Brother   ? Diabetes Brother   ? Breast cancer Maternal Aunt   ? Alcoholism Daughter   ? Hepatitis C Daughter   ? Alcoholism Son   ? Breast cancer Cousin   ? Esophageal cancer Neg Hx   ? Rectal cancer Neg Hx   ? Stomach cancer Neg Hx   ?  ?Past Surgical History:  ?Procedure Laterality Date  ? CATARACT EXTRACTION  2021  ? INDUCED ABORTION    ? PARTIAL HYSTERECTOMY    ? TUBAL LIGATION    ? ?Social History  ? ?Occupational History  ? Not on file  ?Tobacco Use  ? Smoking status: Never  ? Smokeless tobacco: Never  ?Vaping Use  ? Vaping Use: Never used  ?Substance and Sexual Activity  ? Alcohol use: Yes  ?  Alcohol/week: 1.0 standard drink  ?  Types: 1 Standard drinks or equivalent per week  ?  Comment: occassional  ? Drug use: No  ? Sexual activity: Not on file  ? ? ? ? ? ?

## 2021-09-22 ENCOUNTER — Other Ambulatory Visit: Payer: Self-pay | Admitting: Family Medicine

## 2021-10-18 ENCOUNTER — Other Ambulatory Visit: Payer: Self-pay | Admitting: Physician Assistant

## 2021-10-19 ENCOUNTER — Other Ambulatory Visit: Payer: Self-pay

## 2021-11-02 ENCOUNTER — Ambulatory Visit: Payer: Managed Care, Other (non HMO) | Admitting: Physician Assistant

## 2021-12-03 ENCOUNTER — Other Ambulatory Visit: Payer: Self-pay | Admitting: Physician Assistant

## 2021-12-11 ENCOUNTER — Encounter: Payer: Self-pay | Admitting: Internal Medicine

## 2021-12-18 ENCOUNTER — Other Ambulatory Visit: Payer: Self-pay | Admitting: Physician Assistant

## 2021-12-31 ENCOUNTER — Other Ambulatory Visit: Payer: Self-pay | Admitting: Physician Assistant

## 2022-02-17 ENCOUNTER — Encounter: Payer: Self-pay | Admitting: Internal Medicine

## 2022-03-01 ENCOUNTER — Other Ambulatory Visit: Payer: Self-pay | Admitting: Physician Assistant

## 2022-03-23 ENCOUNTER — Encounter: Payer: Self-pay | Admitting: Internal Medicine

## 2022-03-31 ENCOUNTER — Other Ambulatory Visit: Payer: Self-pay | Admitting: Family Medicine

## 2022-03-31 ENCOUNTER — Other Ambulatory Visit: Payer: Self-pay | Admitting: Physician Assistant

## 2022-04-05 ENCOUNTER — Other Ambulatory Visit: Payer: Self-pay | Admitting: Physician Assistant

## 2022-04-05 ENCOUNTER — Other Ambulatory Visit: Payer: Self-pay | Admitting: Family Medicine

## 2022-04-05 DIAGNOSIS — Z1231 Encounter for screening mammogram for malignant neoplasm of breast: Secondary | ICD-10-CM

## 2022-04-08 ENCOUNTER — Encounter: Payer: Self-pay | Admitting: Family Medicine

## 2022-04-08 ENCOUNTER — Ambulatory Visit: Payer: Managed Care, Other (non HMO) | Admitting: Family Medicine

## 2022-04-08 VITALS — BP 128/82 | HR 55 | Temp 97.7°F | Ht 65.0 in | Wt 208.0 lb

## 2022-04-08 DIAGNOSIS — E782 Mixed hyperlipidemia: Secondary | ICD-10-CM

## 2022-04-08 DIAGNOSIS — E1165 Type 2 diabetes mellitus with hyperglycemia: Secondary | ICD-10-CM | POA: Diagnosis not present

## 2022-04-08 DIAGNOSIS — I10 Essential (primary) hypertension: Secondary | ICD-10-CM

## 2022-04-08 DIAGNOSIS — Z23 Encounter for immunization: Secondary | ICD-10-CM

## 2022-04-08 LAB — COMPREHENSIVE METABOLIC PANEL
ALT: 24 U/L (ref 0–35)
AST: 18 U/L (ref 0–37)
Albumin: 4.3 g/dL (ref 3.5–5.2)
Alkaline Phosphatase: 45 U/L (ref 39–117)
BUN: 18 mg/dL (ref 6–23)
CO2: 30 mEq/L (ref 19–32)
Calcium: 10 mg/dL (ref 8.4–10.5)
Chloride: 104 mEq/L (ref 96–112)
Creatinine, Ser: 0.87 mg/dL (ref 0.40–1.20)
GFR: 70.2 mL/min (ref >60.00)
Glucose, Bld: 102 mg/dL — ABNORMAL HIGH (ref 70–99)
Potassium: 3.6 mEq/L (ref 3.5–5.1)
Sodium: 140 mEq/L (ref 135–145)
Total Bilirubin: 0.4 mg/dL (ref 0.2–1.2)
Total Protein: 7.4 g/dL (ref 6.0–8.3)

## 2022-04-08 LAB — CBC WITH DIFFERENTIAL/PLATELET
Basophils Absolute: 0 10*3/uL (ref 0.0–0.1)
Basophils Relative: 0.8 % (ref 0.0–3.0)
Eosinophils Absolute: 0.2 10*3/uL (ref 0.0–0.7)
Eosinophils Relative: 3.3 % (ref 0.0–5.0)
HCT: 40.4 % (ref 36.0–46.0)
Hemoglobin: 13.6 g/dL (ref 12.0–15.0)
Lymphocytes Relative: 38.4 % (ref 12.0–46.0)
Lymphs Abs: 2.3 10*3/uL (ref 0.7–4.0)
MCHC: 33.6 g/dL (ref 30.0–36.0)
MCV: 90.4 fl (ref 78.0–100.0)
Monocytes Absolute: 0.6 10*3/uL (ref 0.1–1.0)
Monocytes Relative: 9.6 % (ref 3.0–12.0)
Neutro Abs: 2.9 10*3/uL (ref 1.4–7.7)
Neutrophils Relative %: 47.9 % (ref 43.0–77.0)
Platelets: 259 10*3/uL (ref 150.0–400.0)
RBC: 4.47 Mil/uL (ref 3.87–5.11)
RDW: 12.5 % (ref 11.5–15.5)
WBC: 6.1 10*3/uL (ref 4.0–10.5)

## 2022-04-08 LAB — LIPID PANEL
Cholesterol: 172 mg/dL (ref 0–200)
HDL: 57.8 mg/dL (ref >39.00)
LDL Cholesterol: 91 mg/dL (ref 0–99)
NonHDL: 114.53
Total CHOL/HDL Ratio: 3
Triglycerides: 118 mg/dL (ref 0.0–149.0)
VLDL: 23.6 mg/dL (ref 0.0–40.0)

## 2022-04-08 LAB — T4, FREE: Free T4: 0.72 ng/dL (ref 0.60–1.60)

## 2022-04-08 LAB — MICROALBUMIN / CREATININE URINE RATIO
Creatinine,U: 101.4 mg/dL
Microalb Creat Ratio: 0.7 mg/g (ref 0.0–30.0)
Microalb, Ur: <0.7 mg/dL (ref 0.0–1.9)

## 2022-04-08 LAB — HEMOGLOBIN A1C: Hgb A1c MFr Bld: 6.5 % (ref 4.6–6.5)

## 2022-04-08 LAB — TSH: TSH: 2.22 u[IU]/mL (ref 0.35–5.50)

## 2022-04-08 NOTE — Progress Notes (Signed)
Subjective:     Patient ID: Brittany Mooney, female    DOB: [birth year], 66 y.o.   MRN: [id number]  Chief Complaint  Patient presents with   Transitions Of Care    No concerns, wanted to switch back to [facility] for PCP    HPI Patient is in today for diabetes and other chronic health conditions.   Hgb A1c 6.7% on 02/2021   She is not on medication for diabetes.  Prefers to control with diet and exercise. States she recently lost 20+ pounds but has gained it back since her mother was sick and passed away.  Hypertension- takes atenolol 50 mg, losartan 25 mg and HCTZ 25 mg  She is on atorvastatin 10 mg daily.  History of mildly elevated ALT  She had a colonoscopy one year ago.  States her mother had colon cancer at age 92.   Review of Systems Constitutional: -fever, -chills, -sweats, -unexpected weight change,-fatigue ENT: -runny nose, -ear pain, -sore throat Cardiology:  -chest pain, -palpitations, -edema Respiratory: -cough, -shortness of breath, -wheezing Gastroenterology: -abdominal pain, -nausea, -vomiting, -diarrhea, -constipation  Hematology: -bleeding or bruising problems Musculoskeletal: -arthralgias, -myalgias, -joint swelling, -back pain Ophthalmology: -vision changes Urology: -dysuria, -difficulty urinating, -hematuria, -urinary frequency, -urgency Neurology: -headache, -weakness, -tingling, -numbness     Health Maintenance Due  Topic Date Due   FOOT EXAM  Never done   OPHTHALMOLOGY EXAM  Never done   Zoster Vaccines- Shingrix (1 of 2) Never done    Past Medical History:  Diagnosis Date   Diabetes mellitus without complication ([place])    controlling with diet   Elevated ALT measurement    Hyperlipidemia    Hypertension    Prediabetes     Past Surgical History:  Procedure Laterality Date   CATARACT EXTRACTION  2021   INDUCED ABORTION     PARTIAL HYSTERECTOMY     TUBAL LIGATION      Family History  Problem Relation Age of Onset   Cancer  Mother    Breast cancer Mother    Hypertension Mother    Rheum arthritis Mother    Thyroid disease Mother    Vaginal cancer Mother    Colon cancer Mother 12   Lung cancer Father 6   Colon polyps Brother    Heart disease Brother    Colitis Brother    COPD Brother    Diabetes Brother    Breast cancer Maternal Aunt    Alcoholism Daughter    Hepatitis C Daughter    Alcoholism Son    Breast cancer Cousin    Esophageal cancer Neg Hx    Rectal cancer Neg Hx    Stomach cancer Neg Hx     Social History   Socioeconomic History   Marital status: Married    Spouse name: Not on file   Number of children: Not on file   Years of education: Not on file   Highest education level: Not on file  Occupational History   Not on file  Tobacco Use   Smoking status: Never   Smokeless tobacco: Never  Vaping Use   Vaping Use: Never used  Substance and Sexual Activity   Alcohol use: Yes    Alcohol/week: 1.0 standard drink of alcohol    Types: 1 Standard drinks or equivalent per week    Comment: occassional   Drug use: No   Sexual activity: Not on file  Other Topics Concern   Not on file  Social History Narrative  Not on file   Social Determinants of Health   Financial Resource Strain: Not on file  Food Insecurity: Not on file  Transportation Needs: Not on file  Physical Activity: Not on file  Stress: Not on file  Social Connections: Not on file  Intimate Partner Violence: Not on file    Outpatient Medications Prior to Visit  Medication Sig Dispense Refill   atenolol (TENORMIN) 50 MG tablet Take 1 tablet by mouth once daily 90 tablet 0   atorvastatin (LIPITOR) 10 MG tablet Take 1 tablet by mouth once daily 90 tablet 0   diphenhydrAMINE (BENADRYL) 25 MG tablet Take 25 mg by mouth every 6 (six) hours as needed.     hydrochlorothiazide (HYDRODIURIL) 25 MG tablet Take 1 tablet by mouth once daily 90 tablet 0   losartan (COZAAR) 25 MG tablet Take 1 tablet by mouth once daily 90  tablet 0   Facility-Administered Medications Prior to Visit  Medication Dose Route Frequency Provider Last Rate Last Admin   0.9 %  sodium chloride infusion  500 mL Intravenous Continuous Thornton Park, MD        No Known Allergies  ROS     Objective:    Physical Exam Constitutional:      General: She is not in acute distress.    Appearance: She is not ill-appearing.  HENT:     Mouth/Throat:     Mouth: Mucous membranes are moist.  Eyes:     Conjunctiva/sclera: Conjunctivae normal.     Pupils: Pupils are equal, round, and reactive to light.  Cardiovascular:     Rate and Rhythm: Normal rate.  Pulmonary:     Effort: Pulmonary effort is normal.     Breath sounds: Normal breath sounds.  Skin:    General: Skin is warm and dry.  Neurological:     General: No focal deficit present.     Mental Status: She is alert and oriented to person, place, and time.  Psychiatric:        Mood and Affect: Mood normal.        Behavior: Behavior normal.     BP 128/82 (BP Location: Left Arm, Patient Position: Sitting, Cuff Size: Large)   Pulse (!) 55   Temp 97.7 F (36.5 C) (Temporal)   Ht '5\' 5"'$  (1.651 m)   Wt 208 lb (94.3 kg)   SpO2 97%   BMI 34.61 kg/m  Wt Readings from Last 3 Encounters:  04/08/22 208 lb (94.3 kg)  09/03/21 198 lb 9.6 oz (90.1 kg)  08/11/21 212 lb (96.2 kg)       Assessment & Plan:   Problem List Items Addressed This Visit       Cardiovascular and Mediastinum   Essential hypertension    Blood pressure controlled.  Continue current medications.  Recommend low-sodium diet, weight loss and exercise. Check labs and follow-up      Relevant Orders   CBC with Differential/Platelet (Completed)   Comprehensive metabolic panel (Completed)     Endocrine   Type 2 diabetes mellitus with hyperglycemia, without long-term current use of insulin ([place]) - Primary    Asymptomatic.  No recent hemoglobin A1c.  Diet controlled.  She does not check blood sugar at home.   Check labs including A1c, thyroid panel, lipids and microalbumin/creatinine urine ratio.  Follow-up pending lab results. Recommend diabetic eye exam      Relevant Orders   CBC with Differential/Platelet (Completed)   Comprehensive metabolic panel (Completed)   Hemoglobin A1c (  Completed)   Lipid panel (Completed)   TSH (Completed)   T4, free (Completed)   Microalbumin / creatinine urine ratio (Completed)     Other   Mixed hyperlipidemia    Continue statin therapy.  Check lipids and follow-up      Relevant Orders   Lipid panel (Completed)   Other Visit Diagnoses     Need for influenza vaccination       Relevant Orders   Flu Vaccine QUAD 57moIM (Fluarix, Fluzone & Alfiuria Quad PF) (Completed)       I am having KArdath Saxmaintain her diphenhydrAMINE, losartan, atorvastatin, atenolol, and hydrochlorothiazide. We will continue to administer sodium chloride.  No orders of the defined types were placed in this encounter.

## 2022-04-08 NOTE — Patient Instructions (Signed)
It was great seeing you today.  Please go downstairs to the lab before you leave today.  You will hear from [place] regarding your results and recommendations.

## 2022-04-08 NOTE — Assessment & Plan Note (Signed)
Continue statin therapy. Check lipids and follow up 

## 2022-04-08 NOTE — Assessment & Plan Note (Signed)
Blood pressure controlled.  Continue current medications.  Recommend low-sodium diet, weight loss and exercise. Check labs and follow-up

## 2022-04-08 NOTE — Assessment & Plan Note (Signed)
Asymptomatic.  No recent hemoglobin A1c.  Diet controlled.  She does not check blood sugar at home.  Check labs including A1c, thyroid panel, lipids and microalbumin/creatinine urine ratio.  Follow-up pending lab results. Recommend diabetic eye exam

## 2022-05-27 ENCOUNTER — Ambulatory Visit
Admission: RE | Admit: 2022-05-27 | Discharge: 2022-05-27 | Disposition: A | Discharge status: Home / Self Care | Payer: Medicare Other | Source: Ambulatory Visit | Attending: Physician Assistant | Admitting: Physician Assistant

## 2022-05-27 DIAGNOSIS — Z1231 Encounter for screening mammogram for malignant neoplasm of breast: Secondary | ICD-10-CM

## 2022-06-09 IMAGING — MG MM DIGITAL SCREENING BILAT W/ TOMO AND CAD
8 series · 8 of 24 positions shown · non-contrast
Comparison: Previous exam(s).

CLINICAL DATA: Screening.

EXAM:
DIGITAL SCREENING BILATERAL MAMMOGRAM WITH TOMOSYNTHESIS AND CAD
TECHNIQUE: Bilateral screening digital craniocaudal and mediolateral oblique
mammograms were obtained. Bilateral screening digital breast
tomosynthesis was performed. The images were evaluated with
computer-aided detection.

[L MLO synth-2D]
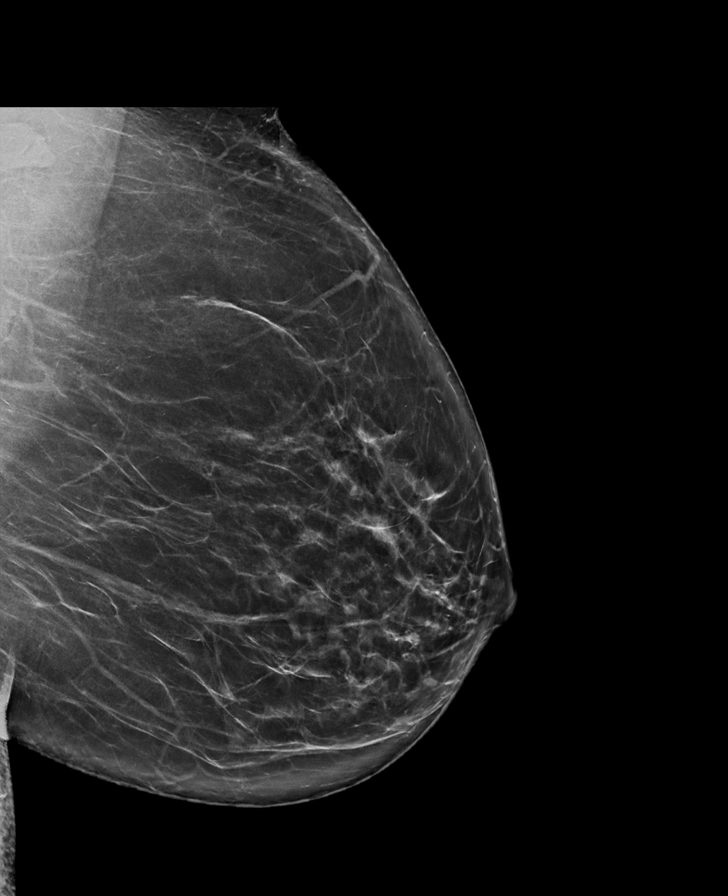

[L CC synth-2D]
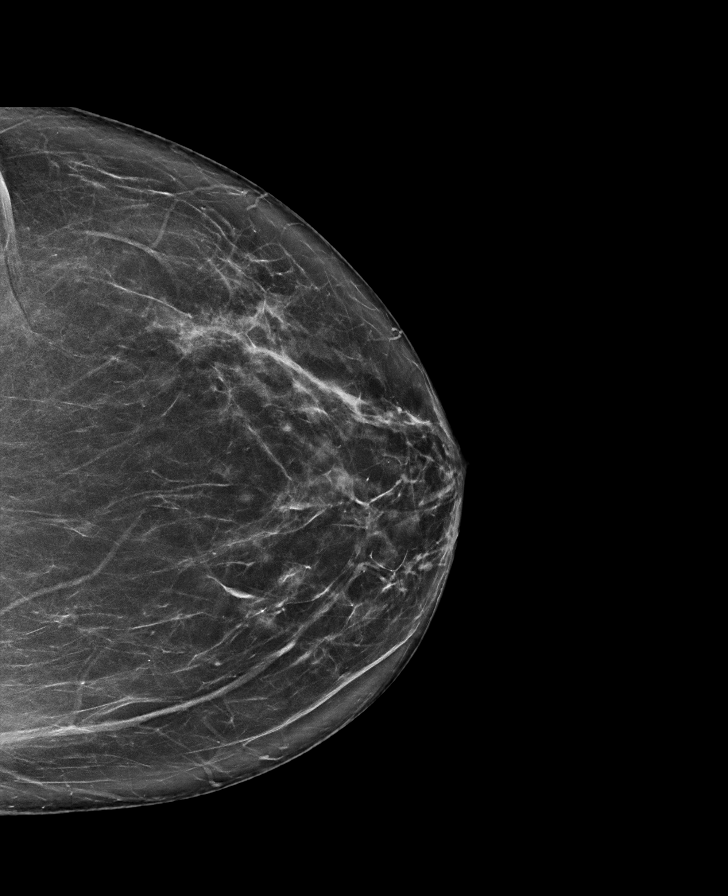

[R MLO synth-2D]
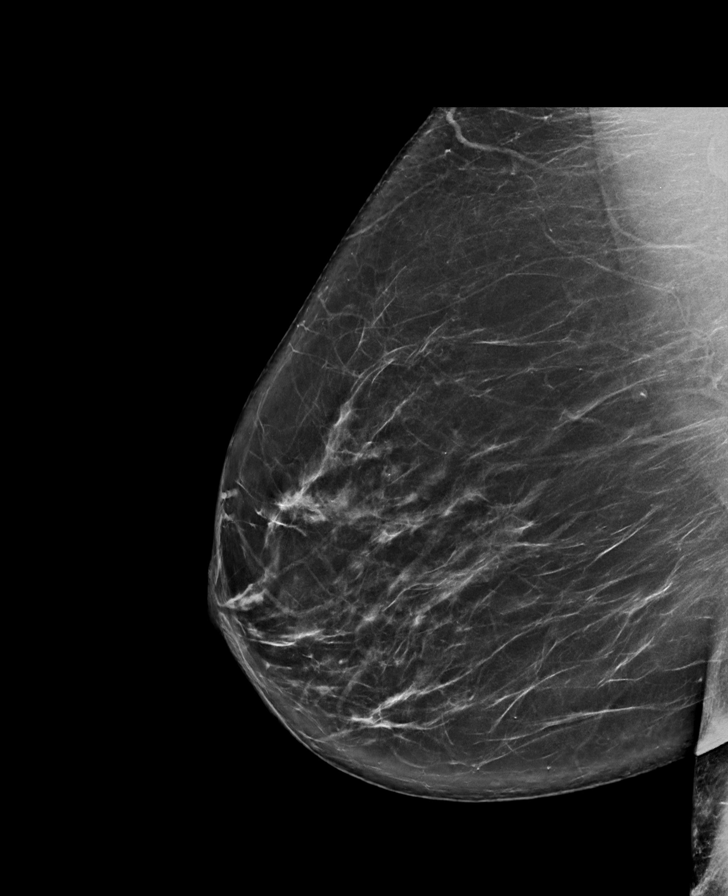

[R CC synth-2D]
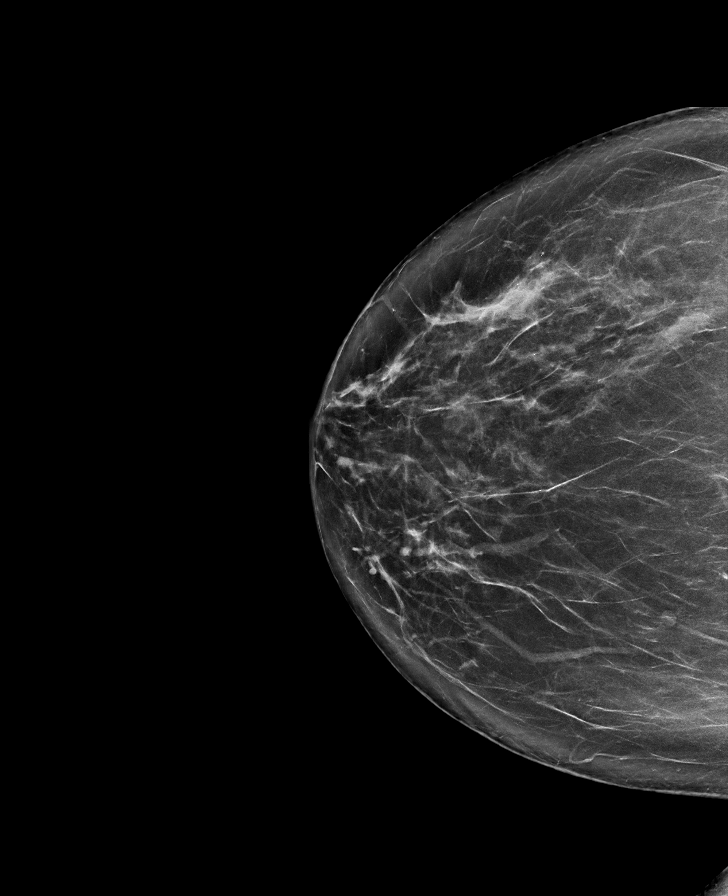

[L MLO tomo · tomo slice 43/84.0]
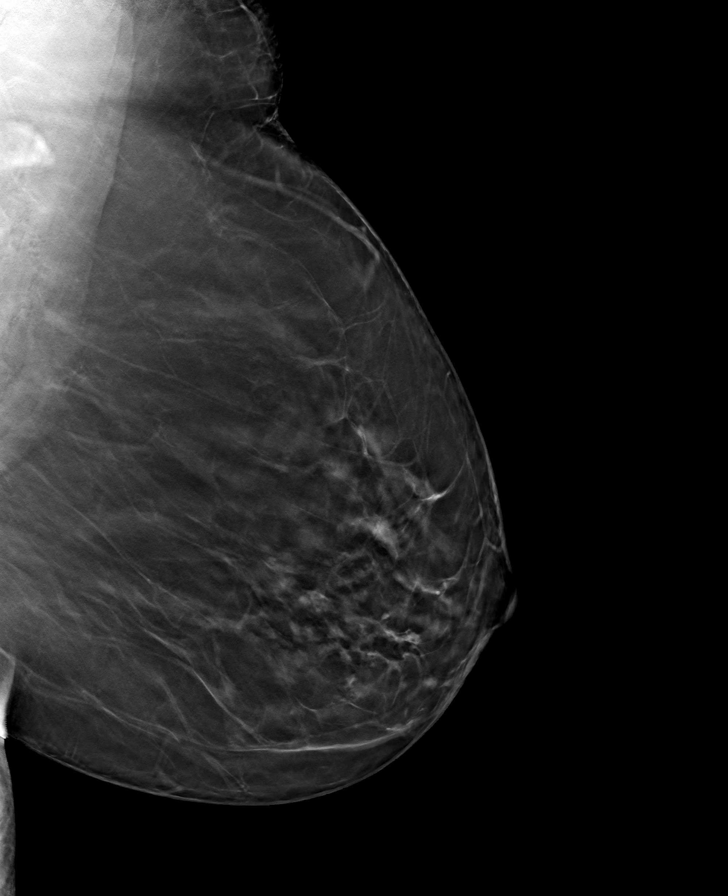

[R CC tomo · tomo slice 39/78.0]
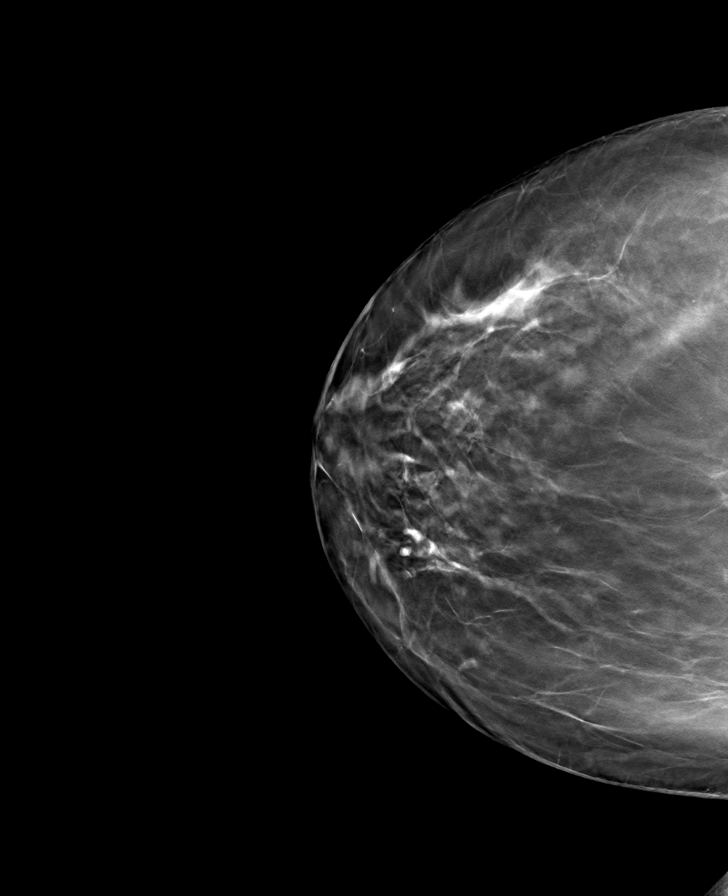

[R MLO tomo · tomo slice 43/85.0]
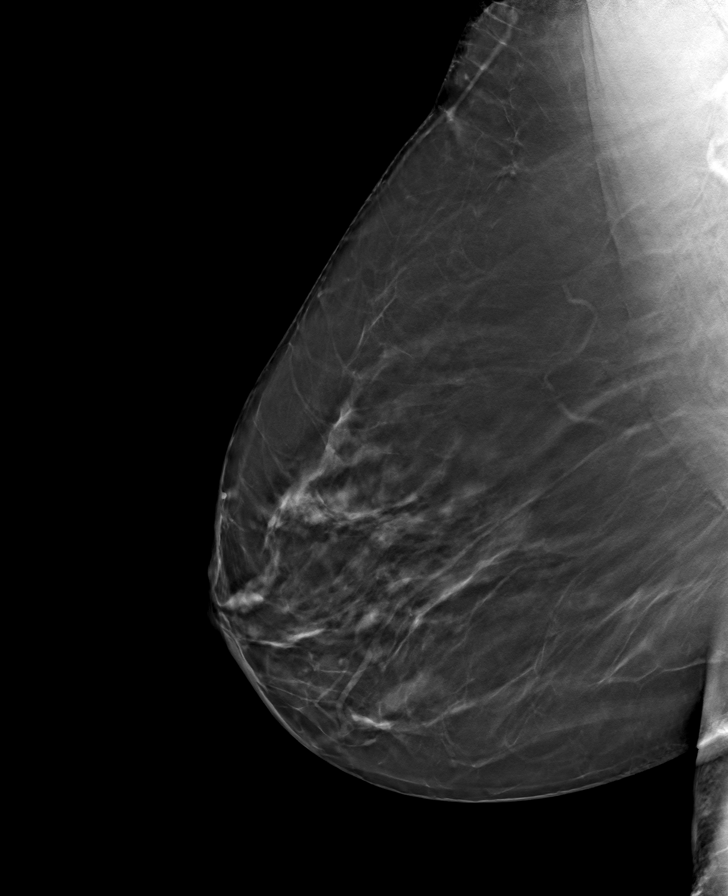

[L CC tomo · tomo slice 39/77.0]
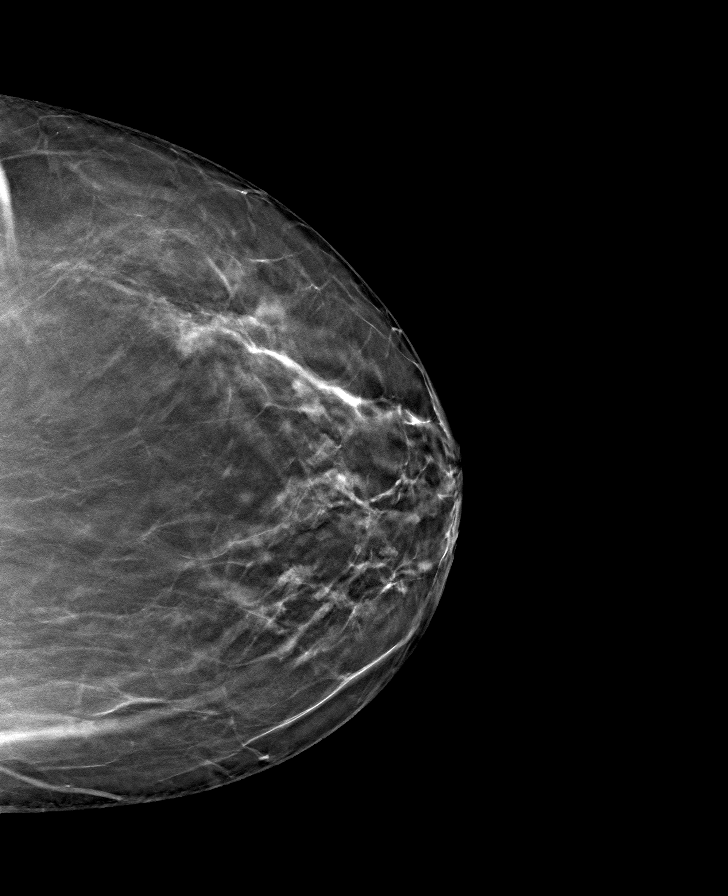

[8 of 24 positions shown; findings below may reference images not displayed]

ACR Breast Density Category b: There are scattered areas of
fibroglandular density.
FINDINGS: There are no findings suspicious for malignancy.
IMPRESSION: No mammographic evidence of malignancy. A result letter of this
screening mammogram will be mailed directly to the patient.

RECOMMENDATION:
Screening mammogram in one year. (Code:51-O-LD2)

BI-RADS CATEGORY  1: Negative.

## 2022-06-26 ENCOUNTER — Other Ambulatory Visit: Payer: Self-pay | Admitting: Family Medicine

## 2022-07-07 ENCOUNTER — Other Ambulatory Visit: Payer: Self-pay | Admitting: Physician Assistant

## 2022-07-07 ENCOUNTER — Other Ambulatory Visit: Payer: Self-pay | Admitting: Family Medicine

## 2022-07-07 MED ORDER — ATORVASTATIN CALCIUM 10 MG PO TABS: 10.0000 mg | ORAL_TABLET | Freq: Every day | ORAL | 0 refills | Status: DC

## 2022-07-07 MED ORDER — HYDROCHLOROTHIAZIDE 25 MG PO TABS: 25.0000 mg | ORAL_TABLET | Freq: Every day | ORAL | 0 refills | Status: DC

## 2022-07-07 MED ORDER — ATENOLOL 50 MG PO TABS: 50.0000 mg | ORAL_TABLET | Freq: Every day | ORAL | 0 refills | Status: DC

## 2022-07-07 MED ORDER — LOSARTAN POTASSIUM 25 MG PO TABS: 25.0000 mg | ORAL_TABLET | Freq: Every day | ORAL | 0 refills | Status: DC

## 2022-07-07 NOTE — Telephone Encounter (Signed)
I am not sure who your clinical pool is to send this to

## 2022-08-05 ENCOUNTER — Ambulatory Visit: Payer: Managed Care, Other (non HMO) | Admitting: Family Medicine

## 2022-08-19 ENCOUNTER — Encounter: Payer: Self-pay | Admitting: Family Medicine

## 2022-08-19 ENCOUNTER — Ambulatory Visit: Payer: Managed Care, Other (non HMO) | Admitting: Family Medicine

## 2022-08-19 VITALS — BP 128/80 | HR 55 | Temp 97.5°F | Ht 65.0 in | Wt 204.0 lb

## 2022-08-19 DIAGNOSIS — I10 Essential (primary) hypertension: Secondary | ICD-10-CM

## 2022-08-19 DIAGNOSIS — E1165 Type 2 diabetes mellitus with hyperglycemia: Secondary | ICD-10-CM

## 2022-08-19 DIAGNOSIS — E1169 Type 2 diabetes mellitus with other specified complication: Secondary | ICD-10-CM

## 2022-08-19 DIAGNOSIS — Z23 Encounter for immunization: Secondary | ICD-10-CM | POA: Diagnosis not present

## 2022-08-19 DIAGNOSIS — E785 Hyperlipidemia, unspecified: Secondary | ICD-10-CM | POA: Diagnosis not present

## 2022-08-19 LAB — COMPREHENSIVE METABOLIC PANEL
ALT: 25 U/L (ref 0–35)
AST: 16 U/L (ref 0–37)
Albumin: 4.1 g/dL (ref 3.5–5.2)
Alkaline Phosphatase: 49 U/L (ref 39–117)
BUN: 15 mg/dL (ref 6–23)
CO2: 31 mEq/L (ref 19–32)
Calcium: 10.3 mg/dL (ref 8.4–10.5)
Chloride: 102 mEq/L (ref 96–112)
Creatinine, Ser: 0.81 mg/dL (ref 0.40–1.20)
GFR: 76.29 mL/min (ref >60.00)
Glucose, Bld: 114 mg/dL — ABNORMAL HIGH (ref 70–99)
Potassium: 3.8 mEq/L (ref 3.5–5.1)
Sodium: 140 mEq/L (ref 135–145)
Total Bilirubin: 0.5 mg/dL (ref 0.2–1.2)
Total Protein: 7.4 g/dL (ref 6.0–8.3)

## 2022-08-19 LAB — CBC WITH DIFFERENTIAL/PLATELET
Basophils Absolute: 0 10*3/uL (ref 0.0–0.1)
Basophils Relative: 0.7 % (ref 0.0–3.0)
Eosinophils Absolute: 0.2 10*3/uL (ref 0.0–0.7)
Eosinophils Relative: 4 % (ref 0.0–5.0)
HCT: 43 % (ref 36.0–46.0)
Hemoglobin: 14.7 g/dL (ref 12.0–15.0)
Lymphocytes Relative: 33.5 % (ref 12.0–46.0)
Lymphs Abs: 2 10*3/uL (ref 0.7–4.0)
MCHC: 34.2 g/dL (ref 30.0–36.0)
MCV: 88.3 fl (ref 78.0–100.0)
Monocytes Absolute: 0.4 10*3/uL (ref 0.1–1.0)
Monocytes Relative: 6.5 % (ref 3.0–12.0)
Neutro Abs: 3.4 10*3/uL (ref 1.4–7.7)
Neutrophils Relative %: 55.3 % (ref 43.0–77.0)
Platelets: 289 10*3/uL (ref 150.0–400.0)
RBC: 4.86 Mil/uL (ref 3.87–5.11)
RDW: 12.8 % (ref 11.5–15.5)
WBC: 6.1 10*3/uL (ref 4.0–10.5)

## 2022-08-19 LAB — LIPID PANEL
Cholesterol: 147 mg/dL (ref 0–200)
HDL: 54.1 mg/dL (ref >39.00)
LDL Cholesterol: 73 mg/dL (ref 0–99)
NonHDL: 93.25
Total CHOL/HDL Ratio: 3
Triglycerides: 99 mg/dL (ref 0.0–149.0)
VLDL: 19.8 mg/dL (ref 0.0–40.0)

## 2022-08-19 LAB — HEMOGLOBIN A1C: Hgb A1c MFr Bld: 6.4 % (ref 4.6–6.5)

## 2022-08-19 NOTE — Assessment & Plan Note (Signed)
Asymptomatic.  A1c 6.5% 4 months ago.  Diet controlled.  She does not check blood sugar at home.  Check labs including A1c, lipids, CBC and CMP.  Follow-up pending lab results. scheduled diabetic eye exam Foot exam done.

## 2022-08-19 NOTE — Patient Instructions (Signed)
Please go downstairs for labs before you leave.  Keep up the good work with your diet.   Try to get at least 150 minutes of physical activity each week.   I will be in touch with your lab results.

## 2022-08-19 NOTE — Assessment & Plan Note (Signed)
Discussed LDL higher than goal 4 months ago. Consider increasing atorvastatin if LDL >70.  Continue low fat diet and increase exercise

## 2022-08-19 NOTE — Progress Notes (Signed)
Subjective:     Patient ID: Brittany Mooney, female    DOB: June 30, 1955, 67 y.o.   MRN: [id number]  Chief Complaint  Patient presents with   Diabetes    Does not check BS at home, denies sx    HPI Patient is in today for follow up on diabetes, HTN and HLD.   Does not check BS at home.  Prefers to control DM with diet and exercise as long as possible.  A1c 6.5% in October 2023.   States has been working hard on her diet. Lost 4 lbs in 4 months.  Eating a lot of fruit.   Hypertension- takes atenolol 50 mg, losartan 25 mg and HCTZ 25 mg   Needs pneumonia vaccine.   Has upcoming eye appt.   Denies fever, chills, dizziness, chest pain, palpitations, shortness of breath, abdominal pain, N/V/D, urinary symptoms, LE edema.      Health Maintenance Due  Topic Date Due   Medicare Annual Wellness (AWV)  Never done   OPHTHALMOLOGY EXAM  Never done    Past Medical History:  Diagnosis Date   Diabetes mellitus without complication ([place])    controlling with diet   Elevated ALT measurement    Hyperlipidemia    Hypertension    Prediabetes     Past Surgical History:  Procedure Laterality Date   CATARACT EXTRACTION  2021   INDUCED ABORTION     PARTIAL HYSTERECTOMY     TUBAL LIGATION      Family History  Problem Relation Age of Onset   Cancer Mother    Breast cancer Mother    Hypertension Mother    Rheum arthritis Mother    Thyroid disease Mother    Vaginal cancer Mother    Colon cancer Mother 55   Lung cancer Father 7   Colon polyps Brother    Heart disease Brother    Colitis Brother    COPD Brother    Diabetes Brother    Breast cancer Maternal Aunt    Alcoholism Daughter    Hepatitis C Daughter    Alcoholism Son    Breast cancer Cousin    Esophageal cancer Neg Hx    Rectal cancer Neg Hx    Stomach cancer Neg Hx     Social History   Socioeconomic History   Marital status: Married    Spouse name: Not on file   Number of children: Not on file    Years of education: Not on file   Highest education level: Not on file  Occupational History   Not on file  Tobacco Use   Smoking status: Never   Smokeless tobacco: Never  Vaping Use   Vaping Use: Never used  Substance and Sexual Activity   Alcohol use: Yes    Alcohol/week: 1.0 standard drink of alcohol    Types: 1 Standard drinks or equivalent per week    Comment: occassional   Drug use: No   Sexual activity: Not on file  Other Topics Concern   Not on file  Social History Narrative   Not on file   Social Determinants of Health   Financial Resource Strain: Not on file  Food Insecurity: Not on file  Transportation Needs: Not on file  Physical Activity: Not on file  Stress: Not on file  Social Connections: Not on file  Intimate Partner Violence: Not on file    Outpatient Medications Prior to Visit  Medication Sig Dispense Refill   atenolol (TENORMIN) 50 MG  tablet Take 1 tablet (50 mg total) by mouth daily. 90 tablet 0   atorvastatin (LIPITOR) 10 MG tablet Take 1 tablet (10 mg total) by mouth daily. 90 tablet 0   diphenhydrAMINE (BENADRYL) 25 MG tablet Take 25 mg by mouth every 6 (six) hours as needed.     hydrochlorothiazide (HYDRODIURIL) 25 MG tablet Take 1 tablet (25 mg total) by mouth daily. 90 tablet 0   losartan (COZAAR) 25 MG tablet Take 1 tablet (25 mg total) by mouth daily. 90 tablet 0   Facility-Administered Medications Prior to Visit  Medication Dose Route Frequency Provider Last Rate Last Admin   0.9 %  sodium chloride infusion  500 mL Intravenous Continuous Thornton Park, MD        No Known Allergies  ROS     Objective:    Physical Exam  BP 128/80 (BP Location: Left Arm, Patient Position: Sitting, Cuff Size: Large)   Pulse (!) 55   Temp (!) 97.5 F (36.4 C) (Temporal)   Ht '5\' 5"'$  (1.651 m)   Wt 204 lb (92.5 kg)   SpO2 99%   BMI 33.95 kg/m  Wt Readings from Last 3 Encounters:  08/19/22 204 lb (92.5 kg)  04/08/22 208 lb (94.3 kg)  09/03/21  198 lb 9.6 oz (90.1 kg)   Alert and in no distress.  Pharyngeal area is normal. Cardiac exam shows a regular rhythm without murmurs or gallops. Lungs are clear to auscultation. No LE edema. Normal foot exam.       Assessment & Plan:   Problem List Items Addressed This Visit       Cardiovascular and Mediastinum   Essential hypertension    BP controlled. Continue atenolol 50 mg, losartan 25 mg and HCTZ 25 mg and low sodium diet. Increase activity.       Relevant Orders   Comprehensive metabolic panel   CBC w/Diff     Endocrine   Hyperlipidemia associated with type 2 diabetes mellitus ([place])    Discussed LDL higher than goal 4 months ago. Consider increasing atorvastatin if LDL >70.  Continue low fat diet and increase exercise       Relevant Orders   Lipid panel   Type 2 diabetes mellitus with hyperglycemia, without long-term current use of insulin ([place]) - Primary    Asymptomatic.  A1c 6.5% 4 months ago.  Diet controlled.  She does not check blood sugar at home.  Check labs including A1c, lipids, CBC and CMP.  Follow-up pending lab results. scheduled diabetic eye exam Foot exam done.       Relevant Orders   HgB A1c   Comprehensive metabolic panel   CBC w/Diff   Other Visit Diagnoses     Need for pneumococcal 20-valent conjugate vaccination       Relevant Orders   Pneumococcal conjugate vaccine 20-valent (Prevnar 20) (Completed)      Prevnar 20 done and counseling done on vaccine and side effects.   I am having Ardath Sax maintain her diphenhydrAMINE, atenolol, hydrochlorothiazide, losartan, and atorvastatin. We will continue to administer sodium chloride.  No orders of the defined types were placed in this encounter.

## 2022-08-19 NOTE — Assessment & Plan Note (Signed)
BP controlled. Continue atenolol 50 mg, losartan 25 mg and HCTZ 25 mg and low sodium diet. Increase activity.

## 2022-10-02 ENCOUNTER — Other Ambulatory Visit: Payer: Self-pay | Admitting: Family Medicine

## 2022-10-05 LAB — HM DIABETES EYE EXAM

## 2022-10-11 ENCOUNTER — Encounter: Payer: Self-pay | Admitting: Family Medicine

## 2022-10-13 ENCOUNTER — Other Ambulatory Visit: Payer: Self-pay

## 2022-10-13 DIAGNOSIS — E1169 Type 2 diabetes mellitus with other specified complication: Secondary | ICD-10-CM

## 2022-10-13 DIAGNOSIS — I10 Essential (primary) hypertension: Secondary | ICD-10-CM

## 2022-10-13 MED ORDER — LOSARTAN POTASSIUM 25 MG PO TABS
25.0000 mg | ORAL_TABLET | Freq: Every day | ORAL | 0 refills | Status: DC
Start: 2022-10-13 — End: 2023-01-14

## 2022-10-13 MED ORDER — ATORVASTATIN CALCIUM 10 MG PO TABS
10.0000 mg | ORAL_TABLET | Freq: Every day | ORAL | 0 refills | Status: DC
Start: 2022-10-13 — End: 2023-01-14

## 2022-10-21 ENCOUNTER — Encounter: Payer: Self-pay | Admitting: Family Medicine

## 2022-10-21 ENCOUNTER — Ambulatory Visit: Payer: Medicare Other | Admitting: Family Medicine

## 2022-10-21 VITALS — BP 126/82 | HR 58 | Temp 97.8°F | Ht 65.0 in | Wt 207.0 lb

## 2022-10-21 DIAGNOSIS — E1169 Type 2 diabetes mellitus with other specified complication: Secondary | ICD-10-CM

## 2022-10-21 DIAGNOSIS — Z8041 Family history of malignant neoplasm of ovary: Secondary | ICD-10-CM

## 2022-10-21 DIAGNOSIS — I10 Essential (primary) hypertension: Secondary | ICD-10-CM | POA: Diagnosis not present

## 2022-10-21 DIAGNOSIS — E1165 Type 2 diabetes mellitus with hyperglycemia: Secondary | ICD-10-CM | POA: Diagnosis not present

## 2022-10-21 DIAGNOSIS — E669 Obesity, unspecified: Secondary | ICD-10-CM | POA: Diagnosis not present

## 2022-10-21 DIAGNOSIS — Z90711 Acquired absence of uterus with remaining cervical stump: Secondary | ICD-10-CM

## 2022-10-21 DIAGNOSIS — E785 Hyperlipidemia, unspecified: Secondary | ICD-10-CM

## 2022-10-21 NOTE — Progress Notes (Signed)
   Patient was late for welcome to Medicare visit so we opted to do a follow up visit.   HTN-  Hypertension- takes atenolol 50 mg, losartan 25 mg and HCTZ 25 mg   Obesity- states she struggles with healthy diet and exercise. Declines to try once weekly GLP-1 for her diabetes and weight.  She is interested in getting  help with meal planning.   DM- diet controlled. A1c 6.4% 2 months ago.   Lipids- LDL 73 with normal HDL and trigs 2 months ago  She is taking atorvastatin 10 mg daily.   Requests to see gynecologist. Concerned about still having her ovaries. States her mother was diagnosed with ovarian cancer and she would like to have her ovaries removed.  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   Denies fever, chills, dizziness, chest pain, palpitations, shortness of breath, abdominal pain, N/V/D, urinary symptoms, LE edema.   Objective: BP 126/82 (BP Location: Left Arm, Patient Position: Sitting, Cuff Size: Large)   Pulse (!) 58   Temp 97.8 F (36.6 C) (Temporal)   Ht 5\' 5"  (1.651 m)   Wt 207 lb (93.9 kg)   SpO2 99%   BMI 34.45 kg/m   Alert and in no distress.  Pharyngeal area is normal. Neck is supple with normal ROM. Normal work of breathing. Normal mood.   Assessment and Plan:  Referral to gynecologist to discuss concern regarding family history of ovarian cancer. Referral to medical nutritionist for diabetes, hypertension, obesity and meal prep issues. Recommend scheduling Welcome to Medicare visit.

## 2022-11-03 NOTE — Progress Notes (Deleted)
Brittany Mooney is a 67 y.o. female who presents for annual wellness visit and follow-up on chronic medical conditions.  She has the following concerns:   Immunization History  Administered Date(s) Administered   Influenza,inj,Quad PF,6+ Mos 03/22/2017, 03/21/2018, 03/12/2019, 04/03/2020, 03/09/2021, 04/08/2022   PFIZER(Purple Top)SARS-COV-2 Vaccination 09/06/2019, 09/27/2019, 04/03/2020   PNEUMOCOCCAL CONJUGATE-20 08/19/2022   Pfizer Covid-19 Vaccine Bivalent Booster 51yrs & up 03/09/2021   Tdap 10/21/2016   Last Pap smear: Last mammogram: Last colonoscopy: Last DEXA: Dentist: Ophtho: Exercise:  Other doctors caring for patient include:   Depression screen:  See questionnaire below.     08/19/2022    8:41 AM 09/03/2021   10:16 AM 03/09/2021   10:04 AM 12/06/2019    9:07 AM 10/26/2018    8:44 AM  Depression screen PHQ 2/9  Decreased Interest 1 0 0 0 0  Down, Depressed, Hopeless 1 0 1 0 0  PHQ - 2 Score 2 0 1 0 0  Altered sleeping 0      Tired, decreased energy 1      Change in appetite 0      Feeling bad or failure about yourself  1      Trouble concentrating 1      Moving slowly or fidgety/restless 0      Suicidal thoughts 0      PHQ-9 Score 5      Difficult doing work/chores Somewhat difficult        Fall Risk Screen: see questionnaire below.    08/19/2022    8:41 AM 09/03/2021   10:15 AM 03/09/2021   10:04 AM 12/06/2019    9:07 AM 10/26/2018    8:44 AM  Fall Risk   Falls in the past year? 0 0 0 0 0  Number falls in past yr: 0 0 0 0 0  Injury with Fall? 0 0 0 0 0  Risk for fall due to : No Fall Risks No Fall Risks No Fall Risks    Follow up Falls evaluation completed Falls evaluation completed Falls evaluation completed      ADL screen:  See questionnaire below Functional Status Survey:     End of Life Discussion:  Patient {ACTIONS; HAS/DOES NOT HAVE:19233} a living will and medical power of attorney  Review of Systems Constitutional: -fever, -chills,  -sweats, -unexpected weight change, -anorexia, -fatigue Allergy: -sneezing, -itching, -congestion Dermatology: denies changing moles, rash, lumps, new worrisome lesions ENT: -runny nose, -ear pain, -sore throat, -hoarseness, -sinus pain, -teeth pain, -tinnitus, -hearing loss, -epistaxis Cardiology:  -chest pain, -palpitations, -edema, -orthopnea, -paroxysmal nocturnal dyspnea Respiratory: -cough, -shortness of breath, -dyspnea on exertion, -wheezing, -hemoptysis Gastroenterology: -abdominal pain, -nausea, -vomiting, -diarrhea, -constipation, -blood in stool, -changes in bowel movement, -dysphagia Hematology: -bleeding or bruising problems Musculoskeletal: -arthralgias, -myalgias, -joint swelling, -back pain, -neck pain, -cramping, -gait changes Ophthalmology: -vision changes, -eye redness, -itching, -discharge Urology: -dysuria, -difficulty urinating, -hematuria, -urinary frequency, -urgency, incontinence Neurology: -headache, -weakness, -tingling, -numbness, -speech abnormality, -memory loss, -falls, -dizziness Psychology:  -depressed mood, -agitation, -sleep problems    PHYSICAL EXAM:  There were no vitals taken for this visit.  General Appearance: Alert, cooperative, no distress, appears stated age Head: Normocephalic, without obvious abnormality, atraumatic Eyes: PERRL, conjunctiva/corneas clear, EOM's intact, fundi benign Ears: Normal TM's and external ear canals Nose: Nares normal, mucosa normal, no drainage or sinus   tenderness Throat: Lips, mucosa, and tongue normal; teeth and gums normal Neck: Supple, no lymphadenopathy; thyroid: no enlargement/tenderness/nodules; no carotid bruit or JVD Back: Spine  nontender, no curvature, ROM normal, no CVA tenderness Lungs: Clear to auscultation bilaterally without wheezes, rales or ronchi; respirations unlabored Chest Wall: No tenderness or deformity Heart: Regular rate and rhythm, S1 and S2 normal, no murmur, rub or gallop Breast Exam:  No tenderness, masses, or nipple discharge or inversion. No axillary lymphadenopathy Abdomen: Soft, non-tender, nondistended, normoactive bowel sounds, no masses, no hepatosplenomegaly Genitalia: Normal external genitalia without lesions.  BUS and vagina normal; cervix without lesions, or cervical motion tenderness. No abnormal vaginal discharge.  Uterus and adnexa not enlarged, nontender, no masses.  Pap performed Rectal: Normal tone, no masses or tenderness; guaiac negative stool Extremities: No clubbing, cyanosis or edema Pulses: 2+ and symmetric all extremities Skin: Skin color, texture, turgor normal, no rashes or lesions Lymph nodes: Cervical, supraclavicular, and axillary nodes normal Neurologic: CNII-XII intact, normal strength, sensation and gait; reflexes 2+ and symmetric throughout Psych: Normal mood, affect, hygiene and grooming.  ASSESSMENT/PLAN: Welcome to Medicare preventive visit    Discussed monthly self breast exams and yearly mammograms; at least 30 minutes of aerobic activity at least 5 days/week and weight-bearing exercise 2x/week; proper sunscreen use reviewed; healthy diet, including goals of calcium and vitamin D intake and alcohol recommendations (less than or equal to 1 drink/day) reviewed; regular seatbelt use; changing batteries in smoke detectors.  Immunization recommendations discussed.  Colonoscopy recommendations reviewed   Medicare Attestation I have personally reviewed: The patient's medical and social history Their use of alcohol, tobacco or illicit drugs Their current medications and supplements The patient's functional ability including ADLs,fall risks, home safety risks, cognitive, and hearing and visual impairment Diet and physical activities Evidence for depression or mood disorders  The patient's weight, height, and BMI have been recorded in the chart.  I have made referrals, counseling, and provided education to the patient based on review of the  above and I have provided the patient with a written personalized care plan for preventive services.     Hetty Blend, NP-C   11/03/2022

## 2022-11-04 ENCOUNTER — Encounter: Payer: Medicare Other | Admitting: Family Medicine

## 2022-11-04 DIAGNOSIS — Z0000 Encounter for general adult medical examination without abnormal findings: Secondary | ICD-10-CM

## 2023-01-01 ENCOUNTER — Other Ambulatory Visit: Payer: Self-pay | Admitting: Family Medicine

## 2023-01-10 ENCOUNTER — Ambulatory Visit: Payer: Medicare Other | Admitting: Dietician

## 2023-01-14 ENCOUNTER — Other Ambulatory Visit: Payer: Self-pay | Admitting: Family Medicine

## 2023-01-14 DIAGNOSIS — I10 Essential (primary) hypertension: Secondary | ICD-10-CM

## 2023-01-14 DIAGNOSIS — E1169 Type 2 diabetes mellitus with other specified complication: Secondary | ICD-10-CM

## 2023-01-14 MED ORDER — LOSARTAN POTASSIUM 25 MG PO TABS
25.0000 mg | ORAL_TABLET | Freq: Every day | ORAL | 0 refills | Status: DC
Start: 2023-01-14 — End: 2023-04-15

## 2023-01-14 MED ORDER — ATORVASTATIN CALCIUM 10 MG PO TABS
10.0000 mg | ORAL_TABLET | Freq: Every day | ORAL | 0 refills | Status: DC
Start: 2023-01-14 — End: 2023-04-15

## 2023-01-31 ENCOUNTER — Ambulatory Visit (INDEPENDENT_AMBULATORY_CARE_PROVIDER_SITE_OTHER): Payer: Medicare Other

## 2023-01-31 VITALS — BP 124/80 | HR 58 | Temp 97.5°F | Ht 65.0 in | Wt 210.6 lb

## 2023-01-31 DIAGNOSIS — Z0000 Encounter for general adult medical examination without abnormal findings: Secondary | ICD-10-CM

## 2023-01-31 NOTE — Progress Notes (Addendum)
Subjective:   Brittany Mooney is a 67 y.o. female who presents for an Initial Medicare Annual Wellness Visit.  Visit Complete: In person  Review of Systems     No ROS. Medicare Wellness Visit. Additional risk factors are reflected in social history.  Sleep Patterns: No sleep issues, feels rested on waking and sleeps 8 hours nightly. Home Safety/Smoke Alarms: Feels safe in home; uses home alarm. Smoke alarms in place. Living environment: 1-story home; Lives with spouse; no needs for DME; good support system. Seat Belt Safety/Bike Helmet: Wears seat belt.  Cardiac Risk Factors include: advanced age (>4men, >20 women)     Objective:    Today's Vitals   01/31/23 0954  BP: 124/80  Pulse: (!) 58  Temp: (!) 97.5 F (36.4 C)  TempSrc: Temporal  SpO2: 94%  Weight: 210 lb 9.6 oz (95.5 kg)  Height: 5\' 5"  (1.651 m)  PainSc: 0-No pain   Body mass index is 35.05 kg/m.     01/31/2023   10:04 AM  Advanced Directives  Does Patient Have a Medical Advance Directive? No  Would patient like information on creating a medical advance directive? No - Patient declined    Current Medications (verified) Outpatient Encounter Medications as of 01/31/2023  Medication Sig   atenolol (TENORMIN) 50 MG tablet Take 1 tablet by mouth once daily   atorvastatin (LIPITOR) 10 MG tablet Take 1 tablet (10 mg total) by mouth daily.   diphenhydrAMINE (BENADRYL) 25 MG tablet Take 25 mg by mouth every 6 (six) hours as needed.   hydrochlorothiazide (HYDRODIURIL) 25 MG tablet Take 1 tablet by mouth once daily   losartan (COZAAR) 25 MG tablet Take 1 tablet (25 mg total) by mouth daily.   Facility-Administered Encounter Medications as of 01/31/2023  Medication   0.9 %  sodium chloride infusion    Allergies (verified) Patient has no known allergies.   History: Past Medical History:  Diagnosis Date   Diabetes mellitus without complication (HCC)    controlling with diet   Elevated ALT measurement     Hyperlipidemia    Hypertension    Prediabetes    Past Surgical History:  Procedure Laterality Date   CATARACT EXTRACTION  2021   INDUCED ABORTION     PARTIAL HYSTERECTOMY     TUBAL LIGATION     Family History  Problem Relation Age of Onset   Cancer Mother    Breast cancer Mother    Hypertension Mother    Rheum arthritis Mother    Thyroid disease Mother    Vaginal cancer Mother    Colon cancer Mother 54   Lung cancer Father 15   Colon polyps Brother    Heart disease Brother    Colitis Brother    COPD Brother    Diabetes Brother    Breast cancer Maternal Aunt    Alcoholism Daughter    Hepatitis C Daughter    Alcoholism Son    Breast cancer Cousin    Esophageal cancer Neg Hx    Rectal cancer Neg Hx    Stomach cancer Neg Hx    Social History   Socioeconomic History   Marital status: Married    Spouse name: Not on file   Number of children: Not on file   Years of education: Not on file   Highest education level: Not on file  Occupational History   Not on file  Tobacco Use   Smoking status: Never   Smokeless tobacco: Never  Vaping Use  Vaping status: Never Used  Substance and Sexual Activity   Alcohol use: Yes    Alcohol/week: 1.0 standard drink of alcohol    Types: 1 Standard drinks or equivalent per week    Comment: occassional   Drug use: No   Sexual activity: Not on file  Other Topics Concern   Not on file  Social History Narrative   Not on file   Social Determinants of Health   Financial Resource Strain: Low Risk  (01/31/2023)   Overall Financial Resource Strain (CARDIA)    Difficulty of Paying Living Expenses: Not hard at all  Food Insecurity: No Food Insecurity (01/31/2023)   Hunger Vital Sign    Worried About Running Out of Food in the Last Year: Never true    Ran Out of Food in the Last Year: Never true  Transportation Needs: No Transportation Needs (01/31/2023)   PRAPARE - Administrator, Civil Service (Medical): No    Lack of  Transportation (Non-Medical): No  Physical Activity: Sufficiently Active (01/31/2023)   Exercise Vital Sign    Days of Exercise per Week: 5 days    Minutes of Exercise per Session: 30 min  Stress: No Stress Concern Present (01/31/2023)   [employer] of Occupational Health - Occupational Stress Questionnaire    Feeling of Stress : Only a little  Social Connections: Unknown (01/31/2023)   Social Connection and Isolation Panel [NHANES]    Frequency of Communication with Friends and Family: More than three times a week    Frequency of Social Gatherings with Friends and Family: More than three times a week    Attends Religious Services: Patient unable to answer    Active Member of Clubs or Organizations: Yes    Attends Engineer, structural: More than 4 times per year    Marital Status: Married    Tobacco Counseling Counseling given: Not Answered   Clinical Intake:  Pre-visit preparation completed: Yes  Pain : No/denies pain Pain Score: 0-No pain     BMI - recorded: 35.05 Nutritional Status: BMI > 30  Obese Nutritional Risks: None Diabetes: No  How often do you need to have someone help you when you read instructions, pamphlets, or other written materials from your doctor or pharmacy?: 1 - Never What is the last grade level you completed in school?: HSG; SOME COLLEGE  Interpreter Needed?: No  Information entered by :: Zayleigh Stroh N. Airik Goodlin, LPN.   Activities of Daily Living    01/31/2023   10:06 AM  In your present state of health, do you have any difficulty performing the following activities:  Hearing? 0  Vision? 0  Difficulty concentrating or making decisions? 0  Walking or climbing stairs? 0  Dressing or bathing? 0  Doing errands, shopping? 0  Preparing Food and eating ? N  Using the Toilet? N  In the past six months, have you accidently leaked urine? N  Do you have problems with loss of bowel control? N  Managing your Medications? N  Managing your  Finances? N  Housekeeping or managing your Housekeeping? N    Patient Care Team: Avanell Shackleton, NP-C as PCP - General (Family Medicine) [facility], [place] as Consulting Physician (Optometry)  Indicate any recent Medical Services you may have received from other than [facility] providers in the past year (date may be approximate).     Assessment:   This is a routine wellness examination for Hevin.  Hearing/Vision screen Hearing Screening - Comments::  Patient has hearing difficulty; No hearing aids. Vision Screening - Comments:: Patient does wear corrective lenses/contacts.  Annual eye exam done by: [facility]   Dietary issues and exercise activities discussed:     Goals Addressed   None   Depression Screen    01/31/2023   10:08 AM 08/19/2022    8:41 AM 09/03/2021   10:16 AM 03/09/2021   10:04 AM 12/06/2019    9:07 AM 10/26/2018    8:44 AM 10/24/2017   11:38 AM  PHQ 2/9 Scores  PHQ - 2 Score 2 2 0 1 0 0 0  PHQ- 9 Score 5 5         Fall Risk    01/31/2023   10:05 AM 08/19/2022    8:41 AM 09/03/2021   10:15 AM 03/09/2021   10:04 AM 12/06/2019    9:07 AM  Fall Risk   Falls in the past year? 0 0 0 0 0  Number falls in past yr: 0 0 0 0 0  Injury with Fall? 0 0 0 0 0  Risk for fall due to : No Fall Risks No Fall Risks No Fall Risks No Fall Risks   Follow up Falls prevention discussed Falls evaluation completed Falls evaluation completed Falls evaluation completed     MEDICARE RISK AT HOME: Medicare Risk at Home Any stairs in or around the home?: No If so, are there any without handrails?: No Home free of loose throw rugs in walkways, pet beds, electrical cords, etc?: Yes Adequate lighting in your home to reduce risk of falls?: Yes Life alert?: No Use of a cane, walker or w/c?: No Grab bars in the bathroom?: Yes Shower chair or bench in shower?: No Elevated toilet seat or a handicapped toilet?: Yes  TIMED UP AND GO:  Was the test performed? Yes  Length of  time to ambulate 10 feet: 8 sec Gait steady and fast without use of assistive device    Cognitive Function:        01/31/2023   10:07 AM  6CIT Screen  What Year? 0 points  What month? 0 points  What time? 0 points  Count back from 20 0 points  Months in reverse 0 points  Repeat phrase 0 points  Total Score 0 points    Immunizations Immunization History  Administered Date(s) Administered   Influenza,inj,Quad PF,6+ Mos 03/22/2017, 03/21/2018, 03/12/2019, 04/03/2020, 03/09/2021, 04/08/2022   PFIZER(Purple Top)SARS-COV-2 Vaccination 09/06/2019, 09/27/2019, 04/03/2020   PNEUMOCOCCAL CONJUGATE-20 08/19/2022   Pfizer Covid-19 Vaccine Bivalent Booster 68yrs & up 03/09/2021   Tdap 10/21/2016    TDAP status: Up to date  Flu Vaccine status: Up to date  Pneumococcal vaccine status: Declined,  Education has been provided regarding the importance of this vaccine but patient still declined. Advised may receive this vaccine at local pharmacy or Health Dept. Aware to provide a copy of the vaccination record if obtained from local pharmacy or Health Dept. Verbalized acceptance and understanding.   Covid-19 vaccine status: Completed vaccines  Qualifies for Shingles Vaccine? Yes   Zostavax completed No   Shingrix Completed?: No.    Education has been provided regarding the importance of this vaccine. Patient has been advised to call insurance company to determine out of pocket expense if they have not yet received this vaccine. Advised may also receive vaccine at local pharmacy or Health Dept. Verbalized acceptance and understanding.  Screening Tests Health Maintenance  Topic Date Due   COVID-19 Vaccine (5 - 2023-24 season) 02/12/2022  INFLUENZA VACCINE  01/13/2023   HEMOGLOBIN A1C  02/19/2023   Diabetic kidney evaluation - Urine ACR  04/09/2023   Diabetic kidney evaluation - eGFR measurement  08/19/2023   FOOT EXAM  08/19/2023   OPHTHALMOLOGY EXAM  10/05/2023   Medicare Annual  Wellness (AWV)  01/31/2024   MAMMOGRAM  05/27/2024   DTaP/Tdap/Td (2 - Td or Tdap) 10/22/2026   Colonoscopy  08/12/2031   Pneumonia Vaccine 15+ Years old  Completed   DEXA SCAN  Completed   Hepatitis C Screening  Completed   HIV Screening  Completed   HPV VACCINES  Aged Out   PAP SMEAR-Modifier  Discontinued   Zoster Vaccines- Shingrix  Discontinued    Health Maintenance  Health Maintenance Due  Topic Date Due   COVID-19 Vaccine (5 - 2023-24 season) 02/12/2022   INFLUENZA VACCINE  01/13/2023    Colorectal cancer screening: Type of screening: Colonoscopy. Completed 08/11/2021. Repeat every 10 years  Mammogram status: Completed 05/27/2022. Repeat every year  Bone Density status: Completed 01/18/2019. Results reflect: Bone density results: NORMAL. Repeat every 5 years.  Lung Cancer Screening: (Low Dose CT Chest recommended if Age 16-80 years, 20 pack-year currently smoking OR have quit w/in 15years.) does not qualify.   Lung Cancer Screening Referral: no  Additional Screening:  Hepatitis C Screening: does qualify; Completed 03/09/2021  Vision Screening: Recommended annual ophthalmology exams for early detection of glaucoma and other disorders of the eye. Is the patient up to date with their annual eye exam?  Yes  Who is the provider or what is the name of the office in which the patient attends annual eye exams? [facility] If pt is not established with a provider, would they like to be referred to a provider to establish care? No .   Dental Screening: Recommended annual dental exams for proper oral hygiene  Diabetic Foot Exam: Diabetic Foot Exam: Completed 08/19/2022  Community Resource Referral / Chronic Care Management: CRR required this visit?  No   CCM required this visit?  No     Plan:     I have personally reviewed and noted the following in the patient's chart:   Medical and social history Use of alcohol, tobacco or illicit drugs  Current medications and  supplements including opioid prescriptions. Patient is not currently taking opioid prescriptions. Functional ability and status Nutritional status Physical activity Advanced directives List of other physicians Hospitalizations, surgeries, and ER visits in previous 12 months Vitals Screenings to include cognitive, depression, and falls Referrals and appointments  In addition, I have reviewed and discussed with patient certain preventive protocols, quality metrics, and best practice recommendations. A written personalized care plan for preventive services as well as general preventive health recommendations were provided to patient.     Mickeal Needy, LPN   [id number]/[id number]   After Visit Summary: Printed and given to patient.  Nurse Notes: Normal cognitive status assessed by direct observation by this Nurse Health Advisor. No abnormalities found.     Medical screening examination/treatment/procedure(s) were performed by non-physician practitioner and as supervising physician I was immediately available for consultation/collaboration.  I agree with above. Jacinta Shoe, MD

## 2023-01-31 NOTE — Patient Instructions (Signed)
[name] , Thank you for taking time to come for your Medicare Wellness Visit. I appreciate your ongoing commitment to your health goals. Please review the following plan we discussed and let me know if I can assist you in the future.   Referrals/Orders/Follow-Ups/Clinician Recommendations: No  This is a list of the screening recommended for you and due dates:  Health Maintenance  Topic Date Due   COVID-19 Vaccine (5 - 2023-24 season) 02/12/2022   Flu Shot  01/13/2023   Hemoglobin A1C  02/19/2023   Yearly kidney health urinalysis for diabetes  04/09/2023   Yearly kidney function blood test for diabetes  08/19/2023   Complete foot exam   08/19/2023   Eye exam for diabetics  10/05/2023   Medicare Annual Wellness Visit  01/31/2024   Mammogram  05/27/2024   DTaP/Tdap/Td vaccine (2 - Td or Tdap) 10/22/2026   Colon Cancer Screening  08/12/2031   Pneumonia Vaccine  Completed   DEXA scan (bone density measurement)  Completed   Hepatitis C Screening  Completed   HIV Screening  Completed   HPV Vaccine  Aged Out   Pap Smear  Discontinued   Zoster (Shingles) Vaccine  Discontinued    Advanced directives: (Declined) Advance directive discussed with you today. Even though you declined this today, please call our office should you change your mind, and we can give you the proper paperwork for you to fill out.  Next Medicare Annual Wellness Visit scheduled for next year: Yes  Preventive Care 40 Years and Older, Female Preventive care refers to lifestyle choices and visits with your health care provider that can promote health and wellness. What does preventive care include? A yearly physical exam. This is also called an annual well check. Dental exams once or twice a year. Routine eye exams. Ask your health care provider how often you should have your eyes checked. Personal lifestyle choices, including: Daily care of your teeth and gums. Regular physical activity. Eating a healthy  diet. Avoiding tobacco and drug use. Limiting alcohol use. Practicing safe sex. Taking low-dose aspirin every day. Taking vitamin and mineral supplements as recommended by your health care provider. What happens during an annual well check? The services and screenings done by your health care provider during your annual well check will depend on your age, overall health, lifestyle risk factors, and family history of disease. Counseling  Your health care provider may ask you questions about your: Alcohol use. Tobacco use. Drug use. Emotional well-being. Home and relationship well-being. Sexual activity. Eating habits. History of falls. Memory and ability to understand (cognition). Work and work Astronomer. Reproductive health. Screening  You may have the following tests or measurements: Height, weight, and BMI. Blood pressure. Lipid and cholesterol levels. These may be checked every 5 years, or more frequently if you are over 89 years old. Skin check. Lung cancer screening. You may have this screening every year starting at age 68 if you have a 30-pack-year history of smoking and currently smoke or have quit within the past 15 years. Fecal occult blood test (FOBT) of the stool. You may have this test every year starting at age 64. Flexible sigmoidoscopy or colonoscopy. You may have a sigmoidoscopy every 5 years or a colonoscopy every 10 years starting at age 31. Hepatitis C blood test. Hepatitis B blood test. Sexually transmitted disease (STD) testing. Diabetes screening. This is done by checking your blood sugar (glucose) after you have not eaten for a while (fasting). You may have this  done every 1-3 years. Bone density scan. This is done to screen for osteoporosis. You may have this done starting at age 9. Mammogram. This may be done every 1-2 years. Talk to your health care provider about how often you should have regular mammograms. Talk with your health care provider about  your test results, treatment options, and if necessary, the need for more tests. Vaccines  Your health care provider may recommend certain vaccines, such as: Influenza vaccine. This is recommended every year. Tetanus, diphtheria, and acellular pertussis (Tdap, Td) vaccine. You may need a Td booster every 10 years. Zoster vaccine. You may need this after age 72. Pneumococcal 13-valent conjugate (PCV13) vaccine. One dose is recommended after age 81. Pneumococcal polysaccharide (PPSV23) vaccine. One dose is recommended after age 34. Talk to your health care provider about which screenings and vaccines you need and how often you need them. This information is not intended to replace advice given to you by your health care provider. Make sure you discuss any questions you have with your health care provider. Document Released: 06/27/2015 Document Revised: 02/18/2016 Document Reviewed: 04/01/2015 Elsevier Interactive Patient Education  2017 [employer].  Fall Prevention in the Home Falls can cause injuries. They can happen to people of all ages. There are many things you can do to make your home safe and to help prevent falls. What can I do on the outside of my home? Regularly fix the edges of walkways and driveways and fix any cracks. Remove anything that might make you trip as you walk through a door, such as a raised step or threshold. Trim any bushes or trees on the path to your home. Use bright outdoor lighting. Clear any walking paths of anything that might make someone trip, such as rocks or tools. Regularly check to see if handrails are loose or broken. Make sure that both sides of any steps have handrails. Any raised decks and porches should have guardrails on the edges. Have any leaves, snow, or ice cleared regularly. Use sand or salt on walking paths during winter. Clean up any spills in your garage right away. This includes oil or grease spills. What can I do in the bathroom? Use  night lights. Install grab bars by the toilet and in the tub and shower. Do not use towel bars as grab bars. Use non-skid mats or decals in the tub or shower. If you need to sit down in the shower, use a plastic, non-slip stool. Keep the floor dry. Clean up any water that spills on the floor as soon as it happens. Remove soap buildup in the tub or shower regularly. Attach bath mats securely with double-sided non-slip rug tape. Do not have throw rugs and other things on the floor that can make you trip. What can I do in the bedroom? Use night lights. Make sure that you have a light by your bed that is easy to reach. Do not use any sheets or blankets that are too big for your bed. They should not hang down onto the floor. Have a firm chair that has side arms. You can use this for support while you get dressed. Do not have throw rugs and other things on the floor that can make you trip. What can I do in the kitchen? Clean up any spills right away. Avoid walking on wet floors. Keep items that you use a lot in easy-to-reach places. If you need to reach something above you, use a strong step stool that  has a grab bar. Keep electrical cords out of the way. Do not use floor polish or wax that makes floors slippery. If you must use wax, use non-skid floor wax. Do not have throw rugs and other things on the floor that can make you trip. What can I do with my stairs? Do not leave any items on the stairs. Make sure that there are handrails on both sides of the stairs and use them. Fix handrails that are broken or loose. Make sure that handrails are as long as the stairways. Check any carpeting to make sure that it is firmly attached to the stairs. Fix any carpet that is loose or worn. Avoid having throw rugs at the top or bottom of the stairs. If you do have throw rugs, attach them to the floor with carpet tape. Make sure that you have a light switch at the top of the stairs and the bottom of the  stairs. If you do not have them, ask someone to add them for you. What else can I do to help prevent falls? Wear shoes that: Do not have high heels. Have rubber bottoms. Are comfortable and fit you well. Are closed at the toe. Do not wear sandals. If you use a stepladder: Make sure that it is fully opened. Do not climb a closed stepladder. Make sure that both sides of the stepladder are locked into place. Ask someone to hold it for you, if possible. Clearly mark and make sure that you can see: Any grab bars or handrails. First and last steps. Where the edge of each step is. Use tools that help you move around (mobility aids) if they are needed. These include: Canes. Walkers. Scooters. Crutches. Turn on the lights when you go into a dark area. Replace any light bulbs as soon as they burn out. Set up your furniture so you have a clear path. Avoid moving your furniture around. If any of your floors are uneven, fix them. If there are any pets around you, be aware of where they are. Review your medicines with your doctor. Some medicines can make you feel dizzy. This can increase your chance of falling. Ask your doctor what other things that you can do to help prevent falls. This information is not intended to replace advice given to you by your health care provider. Make sure you discuss any questions you have with your health care provider. Document Released: 03/27/2009 Document Revised: 11/06/2015 Document Reviewed: 07/05/2014 Elsevier Interactive Patient Education  2017 [employer].

## 2023-04-06 ENCOUNTER — Other Ambulatory Visit: Payer: Self-pay | Admitting: Family Medicine

## 2023-04-15 ENCOUNTER — Other Ambulatory Visit: Payer: Self-pay | Admitting: Family Medicine

## 2023-04-15 DIAGNOSIS — I10 Essential (primary) hypertension: Secondary | ICD-10-CM

## 2023-04-15 DIAGNOSIS — E1169 Type 2 diabetes mellitus with other specified complication: Secondary | ICD-10-CM

## 2023-06-29 ENCOUNTER — Other Ambulatory Visit: Payer: Self-pay | Admitting: Family Medicine

## 2023-06-29 DIAGNOSIS — Z0000 Encounter for general adult medical examination without abnormal findings: Secondary | ICD-10-CM

## 2023-06-30 ENCOUNTER — Ambulatory Visit
Admission: RE | Admit: 2023-06-30 | Discharge: 2023-06-30 | Disposition: A | Discharge status: Home / Self Care | Payer: Medicare Other | Source: Ambulatory Visit | Attending: Family Medicine | Admitting: Family Medicine

## 2023-06-30 DIAGNOSIS — Z0000 Encounter for general adult medical examination without abnormal findings: Secondary | ICD-10-CM

## 2023-07-08 ENCOUNTER — Other Ambulatory Visit: Payer: Self-pay | Admitting: Family Medicine

## 2023-07-25 ENCOUNTER — Other Ambulatory Visit: Payer: Self-pay | Admitting: Family Medicine

## 2023-07-25 DIAGNOSIS — I10 Essential (primary) hypertension: Secondary | ICD-10-CM

## 2023-07-25 DIAGNOSIS — E1169 Type 2 diabetes mellitus with other specified complication: Secondary | ICD-10-CM

## 2023-07-25 NOTE — Telephone Encounter (Signed)
 Copied from CRM [id number]. Topic: Clinical - Medication Refill >> Jul 25, 2023 11:31 AM Justina Oman C wrote: Most Recent Primary Care Visit:  Provider: Margette Sheldon  Department: [facility]  Visit Type: MEDICARE AWV, INITIAL  Date: 01/31/2023  Medication: atenolol  (TENORMIN ) 50 MG tablet,  atorvastatin  (LIPITOR) 10 MG tablet,  hydrochlorothiazide  (HYDRODIURIL ) 25 MG tablet,  losartan  (COZAAR ) 25 MG tablet   Has the patient contacted their pharmacy? Yes. Adah Acron from [facility] [phone] requesting medications for 90 days supply. (Agent: If no, request that the patient contact the pharmacy for the refill. If patient does not wish to contact the pharmacy document the reason why and proceed with request.) (Agent: If yes, when and what did the pharmacy advise?)  Is this the correct pharmacy for this prescription? Yes If no, delete pharmacy and type the correct one.  This is the patient's preferred pharmacy:  [facility] - [place], [place] - [address] [place] [address] Phone: [phone] Fax: [phone]  Has the prescription been filled recently? No  Is the patient out of the medication? No, but will run out of medications.   Has the patient been seen for an appointment in the last year OR does the patient have an upcoming appointment? Yes  Can we respond through MyChart?   Agent: Please be advised that Rx refills may take up to 3 business days. We ask that you follow-up with your pharmacy.

## 2023-07-26 MED ORDER — ATORVASTATIN CALCIUM 10 MG PO TABS: 10.0000 mg | ORAL_TABLET | Freq: Every day | ORAL | 0 refills | Status: DC

## 2023-07-26 MED ORDER — LOSARTAN POTASSIUM 25 MG PO TABS: 25.0000 mg | ORAL_TABLET | Freq: Every day | ORAL | 0 refills | Status: DC

## 2023-07-26 MED ORDER — ATENOLOL 50 MG PO TABS: 50.0000 mg | ORAL_TABLET | Freq: Every day | ORAL | 0 refills | Status: DC

## 2023-07-26 MED ORDER — HYDROCHLOROTHIAZIDE 25 MG PO TABS: 25.0000 mg | ORAL_TABLET | Freq: Every day | ORAL | 0 refills | Status: DC

## 2023-08-18 ENCOUNTER — Encounter: Payer: Self-pay | Admitting: Family Medicine

## 2023-08-18 ENCOUNTER — Ambulatory Visit: Payer: Medicare Other | Admitting: Family Medicine

## 2023-08-18 VITALS — BP 124/76 | HR 56 | Temp 97.6°F | Ht 65.0 in | Wt 213.0 lb

## 2023-08-18 DIAGNOSIS — I10 Essential (primary) hypertension: Secondary | ICD-10-CM | POA: Diagnosis not present

## 2023-08-18 DIAGNOSIS — E785 Hyperlipidemia, unspecified: Secondary | ICD-10-CM

## 2023-08-18 DIAGNOSIS — E669 Obesity, unspecified: Secondary | ICD-10-CM

## 2023-08-18 DIAGNOSIS — E1169 Type 2 diabetes mellitus with other specified complication: Secondary | ICD-10-CM

## 2023-08-18 DIAGNOSIS — Z8249 Family history of ischemic heart disease and other diseases of the circulatory system: Secondary | ICD-10-CM

## 2023-08-18 DIAGNOSIS — Z8041 Family history of malignant neoplasm of ovary: Secondary | ICD-10-CM

## 2023-08-18 DIAGNOSIS — E1165 Type 2 diabetes mellitus with hyperglycemia: Secondary | ICD-10-CM | POA: Diagnosis not present

## 2023-08-18 DIAGNOSIS — Z9189 Other specified personal risk factors, not elsewhere classified: Secondary | ICD-10-CM | POA: Insufficient documentation

## 2023-08-18 LAB — LIPID PANEL
Cholesterol: 164 mg/dL (ref 0–200)
HDL: 60.7 mg/dL (ref >39.00)
LDL Cholesterol: 87 mg/dL (ref 0–99)
NonHDL: 103.74
Total CHOL/HDL Ratio: 3
Triglycerides: 82 mg/dL (ref 0.0–149.0)
VLDL: 16.4 mg/dL (ref 0.0–40.0)

## 2023-08-18 LAB — CBC WITH DIFFERENTIAL/PLATELET
Basophils Absolute: 0 10*3/uL (ref 0.0–0.1)
Basophils Relative: 0.9 % (ref 0.0–3.0)
Eosinophils Absolute: 0.2 10*3/uL (ref 0.0–0.7)
Eosinophils Relative: 2.9 % (ref 0.0–5.0)
HCT: 43 % (ref 36.0–46.0)
Hemoglobin: 14.4 g/dL (ref 12.0–15.0)
Lymphocytes Relative: 33 % (ref 12.0–46.0)
Lymphs Abs: 1.7 10*3/uL (ref 0.7–4.0)
MCHC: 33.4 g/dL (ref 30.0–36.0)
MCV: 89.9 fl (ref 78.0–100.0)
Monocytes Absolute: 0.4 10*3/uL (ref 0.1–1.0)
Monocytes Relative: 7.6 % (ref 3.0–12.0)
Neutro Abs: 2.9 10*3/uL (ref 1.4–7.7)
Neutrophils Relative %: 55.6 % (ref 43.0–77.0)
Platelets: 292 10*3/uL (ref 150.0–400.0)
RBC: 4.78 Mil/uL (ref 3.87–5.11)
RDW: 12.8 % (ref 11.5–15.5)
WBC: 5.3 10*3/uL (ref 4.0–10.5)

## 2023-08-18 LAB — COMPREHENSIVE METABOLIC PANEL
ALT: 31 U/L (ref 0–35)
AST: 17 U/L (ref 0–37)
Albumin: 4.5 g/dL (ref 3.5–5.2)
Alkaline Phosphatase: 53 U/L (ref 39–117)
BUN: 18 mg/dL (ref 6–23)
CO2: 29 meq/L (ref 19–32)
Calcium: 10 mg/dL (ref 8.4–10.5)
Chloride: 104 meq/L (ref 96–112)
Creatinine, Ser: 0.88 mg/dL (ref 0.40–1.20)
GFR: 68.59 mL/min (ref >60.00)
Glucose, Bld: 126 mg/dL — ABNORMAL HIGH (ref 70–99)
Potassium: 4.2 meq/L (ref 3.5–5.1)
Sodium: 140 meq/L (ref 135–145)
Total Bilirubin: 0.5 mg/dL (ref 0.2–1.2)
Total Protein: 7.7 g/dL (ref 6.0–8.3)

## 2023-08-18 LAB — MICROALBUMIN / CREATININE URINE RATIO
Creatinine,U: 93.6 mg/dL
Microalb Creat Ratio: 7.5 mg/g (ref 0.0–30.0)
Microalb, Ur: <0.7 mg/dL (ref 0.0–1.9)

## 2023-08-18 LAB — HEMOGLOBIN A1C: Hgb A1c MFr Bld: 6.5 % (ref 4.6–6.5)

## 2023-08-18 MED ORDER — BLOOD GLUCOSE MONITORING SUPPL DEVI: 0 refills | Status: DC

## 2023-08-18 MED ORDER — LANCET DEVICE MISC
0 refills | Status: DC
Start: 2023-08-18 — End: 2024-04-05

## 2023-08-18 MED ORDER — BLOOD GLUCOSE TEST VI STRP: ORAL_STRIP | 6 refills | Status: DC

## 2023-08-18 MED ORDER — LANCETS MISC. MISC: 6 refills | Status: DC

## 2023-08-18 NOTE — Assessment & Plan Note (Signed)
 Asymptomatic.  Diet controlled.  She does not check blood sugar at home.  Check labs including A1c, lipids, CBC and CMP.  Follow-up pending lab results. Recommend diabetic eye exam Urine microalbumin ordered

## 2023-08-18 NOTE — Assessment & Plan Note (Signed)
BP controlled. Continue atenolol 50 mg, losartan 25 mg and HCTZ 25 mg and low sodium diet. Increase activity.

## 2023-08-18 NOTE — Progress Notes (Signed)
 Subjective:     Patient ID: Brittany Mooney, female    DOB: 1955/10/21, 68 y.o.   MRN: [id number]  Chief Complaint  Patient presents with   Medical Management of Chronic Issues    fasting    HPI   History of Present Illness         She is here for follow-up on chronic health conditions including diabetes, hypertension and hyperlipidemia.  OB/GYN- ovarian cancer and breast cancer in mother  Partial hysterectomy   DM- last A1c 6.4% Does not check BS at home.  Prefers to control DM with diet and exercise as long as possible.   She limits carbohydrates. Uses an air fryer.   On statin therapy   Hypertension- takes atenolol 50 mg, losartan 25 mg and HCTZ 25 mg   Family hx of heart disease in father       There are no preventive care reminders to display for this patient.   Past Medical History:  Diagnosis Date   Diabetes mellitus without complication (HCC)    controlling with diet   Elevated ALT measurement    Hyperlipidemia    Hypertension    Prediabetes     Past Surgical History:  Procedure Laterality Date   CATARACT EXTRACTION  2021   INDUCED ABORTION     PARTIAL HYSTERECTOMY     TUBAL LIGATION      Family History  Problem Relation Age of Onset   Cancer Mother    Breast cancer Mother    Hypertension Mother    Rheum arthritis Mother    Thyroid disease Mother    Vaginal cancer Mother    Colon cancer Mother 72   Lung cancer Father 47   Colon polyps Brother    Heart disease Brother    Colitis Brother    COPD Brother    Diabetes Brother    Breast cancer Maternal Aunt    Alcoholism Daughter    Hepatitis C Daughter    Alcoholism Son    Breast cancer Cousin    Esophageal cancer Neg Hx    Rectal cancer Neg Hx    Stomach cancer Neg Hx     Social History   Socioeconomic History   Marital status: Married    Spouse name: Not on file   Number of children: Not on file   Years of education: Not on file   Highest education level: Some  college, no degree  Occupational History   Not on file  Tobacco Use   Smoking status: Never   Smokeless tobacco: Never  Vaping Use   Vaping status: Never Used  Substance and Sexual Activity   Alcohol use: Yes    Alcohol/week: 1.0 standard drink of alcohol    Types: 1 Standard drinks or equivalent per week    Comment: occassional   Drug use: No   Sexual activity: Not on file  Other Topics Concern   Not on file  Social History Narrative   Not on file   Social Drivers of Health   Financial Resource Strain: Low Risk  (08/18/2023)   Overall Financial Resource Strain (CARDIA)    Difficulty of Paying Living Expenses: Not very hard  Food Insecurity: No Food Insecurity (08/18/2023)   Hunger Vital Sign    Worried About Running Out of Food in the Last Year: Never true    Ran Out of Food in the Last Year: Never true  Transportation Needs: No Transportation Needs (08/18/2023)   PRAPARE - Transportation  Lack of Transportation (Medical): No    Lack of Transportation (Non-Medical): No  Physical Activity: Inactive (08/18/2023)   Exercise Vital Sign    Days of Exercise per Week: 0 days    Minutes of Exercise per Session: 30 min  Stress: Stress Concern Present (08/18/2023)   [employer] of Occupational Health - Occupational Stress Questionnaire    Feeling of Stress : To some extent  Social Connections: Moderately Integrated (08/18/2023)   Social Connection and Isolation Panel [NHANES]    Frequency of Communication with Friends and Family: Three times a week    Frequency of Social Gatherings with Friends and Family: Once a week    Attends Religious Services: Never    Database administrator or Organizations: No    Attends Engineer, structural: More than 4 times per year    Marital Status: Married  Catering manager Violence: Not At Risk (01/31/2023)   Humiliation, Afraid, Rape, and Kick questionnaire    Fear of Current or Ex-Partner: No    Emotionally Abused: No    Physically  Abused: No    Sexually Abused: No    Outpatient Medications Prior to Visit  Medication Sig Dispense Refill   atenolol (TENORMIN) 50 MG tablet Take 1 tablet (50 mg total) by mouth daily. Last fill until seen by provider. Please schedule fasting office visit. 30 tablet 0   atorvastatin (LIPITOR) 10 MG tablet Take 1 tablet (10 mg total) by mouth daily. Last fill until seen by provider. Please schedule fasting office visit. 30 tablet 0   diphenhydrAMINE (BENADRYL) 25 MG tablet Take 25 mg by mouth every 6 (six) hours as needed.     hydrochlorothiazide (HYDRODIURIL) 25 MG tablet Take 1 tablet (25 mg total) by mouth daily. Last fill until seen by provider. Please schedule fasting office visit. 30 tablet 0   losartan (COZAAR) 25 MG tablet Take 1 tablet (25 mg total) by mouth daily. Last fill until seen by provider. Please schedule fasting office visit. 30 tablet 0   Facility-Administered Medications Prior to Visit  Medication Dose Route Frequency Provider Last Rate Last Admin   0.9 %  sodium chloride infusion  500 mL Intravenous Continuous Tressia Danas, MD        No Known Allergies  Review of Systems  Constitutional:  Negative for chills, fever and weight loss.  Eyes:  Negative for blurred vision and double vision.  Respiratory:  Negative for shortness of breath.   Cardiovascular:  Negative for chest pain, palpitations and leg swelling.  Gastrointestinal:  Negative for abdominal pain, constipation, diarrhea, nausea and vomiting.  Genitourinary:  Negative for dysuria, frequency and urgency.  Neurological:  Negative for dizziness and focal weakness.       Objective:    Physical Exam Constitutional:      General: She is not in acute distress.    Appearance: She is not ill-appearing.  Eyes:     Extraocular Movements: Extraocular movements intact.     Conjunctiva/sclera: Conjunctivae normal.  Cardiovascular:     Rate and Rhythm: Normal rate and regular rhythm.  Pulmonary:      Effort: Pulmonary effort is normal.     Breath sounds: Normal breath sounds.  Musculoskeletal:     Cervical back: Normal range of motion and neck supple.     Right lower leg: No edema.     Left lower leg: No edema.  Skin:    General: Skin is warm and dry.  Neurological:     General:  No focal deficit present.     Mental Status: She is alert and oriented to person, place, and time.  Psychiatric:        Mood and Affect: Mood normal.        Behavior: Behavior normal.        Thought Content: Thought content normal.      BP 124/76 (BP Location: Left Arm, Patient Position: Sitting)   Pulse (!) 56   Temp 97.6 F (36.4 C) (Temporal)   Ht 5\' 5"  (1.651 m)   Wt 213 lb (96.6 kg)   SpO2 98%   BMI 35.45 kg/m  Wt Readings from Last 3 Encounters:  08/18/23 213 lb (96.6 kg)  01/31/23 210 lb 9.6 oz (95.5 kg)  10/21/22 207 lb (93.9 kg)       Assessment & Plan:   Problem List Items Addressed This Visit     At risk for heart disease   Currently cannot afford CT Coronary Calcium test.  Discussed good control of DM, HLD and HTN. She is sedentary.  Does not smoke.  Father died from heart disease in his late 97s.  Referral to cardiology for consult regarding increased risk for CAD, heart attack, stroke.       Relevant Orders   Ambulatory referral to Cardiology   Essential hypertension   BP controlled. Continue atenolol 50 mg, losartan 25 mg and HCTZ 25 mg and low sodium diet. Increase activity.       Relevant Orders   CBC with Differential/Platelet (Completed)   Comprehensive metabolic panel (Completed)   Ambulatory referral to Cardiology   Family history of heart disease in female family member before age 47   Relevant Orders   Ambulatory referral to Cardiology   Family history of ovarian cancer   Relevant Orders   Ambulatory referral to Gynecology   Hyperlipidemia associated with type 2 diabetes mellitus (HCC)   Consider increasing atorvastatin if LDL >70.  Continue low fat  diet and increase exercise       Relevant Orders   Lipid panel (Completed)   Ambulatory referral to Cardiology   Obesity (BMI 30-39.9)   Work on diet and exercise. Discussed starting GLP-1 and she declines.       Type 2 diabetes mellitus with hyperglycemia, without long-term current use of insulin (HCC) - Primary   Asymptomatic.  Diet controlled.  She does not check blood sugar at home.  Check labs including A1c, lipids, CBC and CMP.  Follow-up pending lab results. Recommend diabetic eye exam Urine microalbumin ordered       Relevant Medications   Blood Glucose Monitoring Suppl DEVI   Glucose Blood (BLOOD GLUCOSE TEST STRIPS) STRP   Lancet Device MISC   Lancets Misc. MISC   Other Relevant Orders   CBC with Differential/Platelet (Completed)   Comprehensive metabolic panel (Completed)   Hemoglobin A1c (Completed)   Microalbumin / creatinine urine ratio (Completed)     I am having Gennie Alma start on Blood Glucose Monitoring Suppl, BLOOD GLUCOSE TEST STRIPS, Lancet Device, and Lancets Misc.. I am also having her maintain her diphenhydrAMINE, atenolol, atorvastatin, hydrochlorothiazide, and losartan. We will continue to administer sodium chloride.  Meds ordered this encounter  Medications   Blood Glucose Monitoring Suppl DEVI    Sig: May substitute to any manufacturer covered by patient's insurance.    Dispense:  1 each    Refill:  0   Glucose Blood (BLOOD GLUCOSE TEST STRIPS) STRP    Sig: May substitute to  any manufacturer covered by [employer]. Use to check blood sugars 2 times daily.    Dispense:  200 strip    Refill:  6   Lancet Device MISC    Sig: May substitute to any manufacturer covered by patient's insurance.    Dispense:  1 each    Refill:  0   Lancets Misc. MISC    Sig: May substitute to any manufacturer covered by patient's insurance. Use to check blood sugars 2 times daily.    Dispense:  200 each    Refill:  6

## 2023-08-18 NOTE — Assessment & Plan Note (Signed)
 Consider increasing atorvastatin if LDL >70.  Continue low fat diet and increase exercise

## 2023-08-18 NOTE — Assessment & Plan Note (Signed)
 Currently cannot afford CT Coronary Calcium test.  Discussed good control of DM, HLD and HTN. She is sedentary.  Does not smoke.  Father died from heart disease in his late 97s.  Referral to cardiology for consult regarding increased risk for CAD, heart attack, stroke.

## 2023-08-18 NOTE — Patient Instructions (Signed)
 Please go downstairs for labs and a urine test before you leave.  Continue your current medications.  Start checking your blood sugars.  We will send in an meter and testing supplies to your pharmacy.  Continue to limit sugar and carbohydrates such as potatoes, bread, pasta and rice.  Try to increase your physical activity even 10 to 15 minutes/day while be beneficial.  I am referring you to a gynecologist and they should call you to schedule a visit.  If it has been 2 weeks and you have not heard from them, let me know.  I am also referring you to cardiology for an evaluation due to your family history and chronic health conditions.  Make sure you are up-to-date and getting annual eye exams because of your diabetes.

## 2023-08-18 NOTE — Assessment & Plan Note (Signed)
 Work on diet and exercise. Discussed starting GLP-1 and she declines.

## 2023-08-19 ENCOUNTER — Encounter: Payer: Self-pay | Admitting: Family Medicine

## 2023-08-26 ENCOUNTER — Other Ambulatory Visit: Payer: Self-pay | Admitting: Family Medicine

## 2023-08-26 DIAGNOSIS — E1169 Type 2 diabetes mellitus with other specified complication: Secondary | ICD-10-CM

## 2023-08-26 DIAGNOSIS — I10 Essential (primary) hypertension: Secondary | ICD-10-CM

## 2023-09-29 ENCOUNTER — Encounter: Payer: Self-pay | Admitting: Obstetrics and Gynecology

## 2023-09-29 ENCOUNTER — Ambulatory Visit (INDEPENDENT_AMBULATORY_CARE_PROVIDER_SITE_OTHER): Admitting: Obstetrics and Gynecology

## 2023-09-29 VITALS — BP 122/64 | HR 57 | Temp 98.1°F | Ht 65.75 in | Wt 211.0 lb

## 2023-09-29 DIAGNOSIS — Z803 Family history of malignant neoplasm of breast: Secondary | ICD-10-CM

## 2023-09-29 DIAGNOSIS — Z8041 Family history of malignant neoplasm of ovary: Secondary | ICD-10-CM | POA: Diagnosis not present

## 2023-09-29 DIAGNOSIS — Z9189 Other specified personal risk factors, not elsewhere classified: Secondary | ICD-10-CM | POA: Diagnosis not present

## 2023-09-29 DIAGNOSIS — R3915 Urgency of urination: Secondary | ICD-10-CM | POA: Diagnosis not present

## 2023-09-29 DIAGNOSIS — Z01419 Encounter for gynecological examination (general) (routine) without abnormal findings: Secondary | ICD-10-CM | POA: Insufficient documentation

## 2023-09-29 DIAGNOSIS — Z1331 Encounter for screening for depression: Secondary | ICD-10-CM | POA: Diagnosis not present

## 2023-09-29 LAB — URINALYSIS, COMPLETE W/RFL CULTURE
Bacteria, UA: NONE SEEN /HPF
Bilirubin Urine: NEGATIVE
Casts: NONE SEEN /LPF
Crystals: NONE SEEN /HPF
Glucose, UA: NEGATIVE
Hgb urine dipstick: NEGATIVE
Ketones, ur: NEGATIVE
Leukocyte Esterase: NEGATIVE
Nitrites, Initial: NEGATIVE
Protein, ur: NEGATIVE
RBC / HPF: NONE SEEN /HPF (ref 0–2)
Specific Gravity, Urine: 1.015 (ref 1.001–1.035)
WBC, UA: NONE SEEN /HPF (ref 0–5)
Yeast: NONE SEEN /HPF
pH: 5.5 (ref 5.0–8.0)

## 2023-09-29 LAB — NO CULTURE INDICATED

## 2023-09-29 NOTE — Patient Instructions (Signed)

## 2023-09-29 NOTE — Progress Notes (Signed)
 68 y.o. [id number] postmenopausal female s/p TVH (68yo, fibroid) here for annual exam-high risk Medicare. Married.  Notes urinary urgency > incontinence. No nocturia. Also concerned with family history of ovarian cancer.  Patient's mother died over the past year from ovarian cancer, diagnosed at 67 years old.  She also had a history of breast and vulvar cancer.  Patient would like an oophorectomy since her is no recommended screening for ovarian cancer.  Postmenopausal bleeding: none Pelvic discharge or pain: none Breast mass, nipple discharge or skin changes : none Last PAP: No results found for: "DIAGPAP", "HPVHIGH", "ADEQPAP" Last mammogram: 06/30/2023 BI-RADS 1, density B Last DXA: 01/18/2019, normal Last colonoscopy: 08/11/2021 Sexually active: Yes Exercising: No Smoker: No  Flowsheet Row Office Visit from 09/29/2023 in [facility]  PHQ-2 Total Score 0       GYN HISTORY: Prior hysterectomy  OB History  Gravida Para Term Preterm AB Living  4 2 2  2 2   SAB IAB Ectopic Multiple Live Births  1 1       # Outcome Date GA Lbr Len/2nd Weight Sex Type Anes PTL Lv  4 IAB           3 SAB           2 Term           1 Term             Past Medical History:  Diagnosis Date   Diabetes mellitus without complication (HCC)    controlling with diet   Elevated ALT measurement    Hyperlipidemia    Hypertension    Prediabetes     Past Surgical History:  Procedure Laterality Date   CATARACT EXTRACTION  2021   INDUCED ABORTION     PARTIAL HYSTERECTOMY     TUBAL LIGATION      Current Outpatient Medications on File Prior to Visit  Medication Sig Dispense Refill   atenolol (TENORMIN) 50 MG tablet Take 1 tablet (50 mg total) by mouth daily. Last fill until seen by provider. Please schedule fasting office visit. 30 tablet 0   atorvastatin (LIPITOR) 10 MG tablet TAKE 1 TABLET DAILY 90 tablet 1   Blood Glucose Monitoring Suppl DEVI May substitute to any  manufacturer covered by patient's insurance. 1 each 0   diphenhydrAMINE (BENADRYL) 25 MG tablet Take 25 mg by mouth every 6 (six) hours as needed.     Glucose Blood (BLOOD GLUCOSE TEST STRIPS) STRP May substitute to any manufacturer covered by patient's insurance. Use to check blood sugars 2 times daily. 200 strip 6   hydrochlorothiazide (HYDRODIURIL) 25 MG tablet Take 1 tablet (25 mg total) by mouth daily. Last fill until seen by provider. Please schedule fasting office visit. 30 tablet 0   Lancet Device MISC May substitute to any manufacturer covered by patient's insurance. 1 each 0   Lancets Misc. MISC May substitute to any manufacturer covered by patient's insurance. Use to check blood sugars 2 times daily. 200 each 6   losartan (COZAAR) 25 MG tablet TAKE 1 TABLET DAILY 90 tablet 1   Current Facility-Administered Medications on File Prior to Visit  Medication Dose Route Frequency Provider Last Rate Last Admin   0.9 %  sodium chloride infusion  500 mL Intravenous Continuous Tressia Danas, MD        Social History   Socioeconomic History   Marital status: Married    Spouse name: Not on file   Number of  children: Not on file   Years of education: Not on file   Highest education level: Some college, no degree  Occupational History   Not on file  Tobacco Use   Smoking status: Never   Smokeless tobacco: Never  Vaping Use   Vaping status: Never Used  Substance and Sexual Activity   Alcohol use: Yes    Alcohol/week: 1.0 standard drink of alcohol    Types: 1 Standard drinks or equivalent per week    Comment: occassional   Drug use: No   Sexual activity: Yes  Other Topics Concern   Not on file  Social History Narrative   Not on file   Social Drivers of Health   Financial Resource Strain: Low Risk  (08/18/2023)   Overall Financial Resource Strain (CARDIA)    Difficulty of Paying Living Expenses: Not very hard  Food Insecurity: No Food Insecurity (08/18/2023)   Hunger Vital Sign     Worried About Running Out of Food in the Last Year: Never true    Ran Out of Food in the Last Year: Never true  Transportation Needs: No Transportation Needs (08/18/2023)   PRAPARE - Administrator, Civil Service (Medical): No    Lack of Transportation (Non-Medical): No  Physical Activity: Inactive (08/18/2023)   Exercise Vital Sign    Days of Exercise per Week: 0 days    Minutes of Exercise per Session: 30 min  Stress: Stress Concern Present (08/18/2023)   [employer] of Occupational Health - Occupational Stress Questionnaire    Feeling of Stress : To some extent  Social Connections: Moderately Integrated (08/18/2023)   Social Connection and Isolation Panel [NHANES]    Frequency of Communication with Friends and Family: Three times a week    Frequency of Social Gatherings with Friends and Family: Once a week    Attends Religious Services: Never    Database administrator or Organizations: No    Attends Engineer, structural: More than 4 times per year    Marital Status: Married  Catering manager Violence: Not At Risk (01/31/2023)   Humiliation, Afraid, Rape, and Kick questionnaire    Fear of Current or Ex-Partner: No    Emotionally Abused: No    Physically Abused: No    Sexually Abused: No    Family History  Problem Relation Age of Onset   Ovarian cancer Mother    Breast cancer Mother    Hypertension Mother    Rheum arthritis Mother    Thyroid disease Mother    Vaginal cancer Mother    Colon cancer Mother 22   Lung cancer Father 75   Colon polyps Brother    Heart disease Brother    Colitis Brother    COPD Brother    Diabetes Brother    Breast cancer Maternal Aunt        x2   Alcoholism Daughter    Hepatitis C Daughter    Alcoholism Son    Breast cancer Cousin        x2   Esophageal cancer Neg Hx    Rectal cancer Neg Hx    Stomach cancer Neg Hx     No Known Allergies    PE Today's Vitals   09/29/23 1434  BP: 122/64  Pulse: (!) 57   Temp: 98.1 F (36.7 C)  TempSrc: Oral  SpO2: 98%  Weight: 211 lb (95.7 kg)  Height: 5' 5.75" (1.67 m)   Body mass index is 34.32 kg/m.  Physical Exam Vitals reviewed. Exam conducted with a chaperone present.  Constitutional:      General: She is not in acute distress.    Appearance: Normal appearance.  HENT:     Head: Normocephalic and atraumatic.     Nose: Nose normal.  Eyes:     Extraocular Movements: Extraocular movements intact.     Conjunctiva/sclera: Conjunctivae normal.  Neck:     Thyroid: No thyroid mass, thyromegaly or thyroid tenderness.  Pulmonary:     Effort: Pulmonary effort is normal.  Chest:     Chest wall: No mass or tenderness.  Breasts:    Right: Normal. No swelling, mass, nipple discharge or tenderness.     Left: Normal. No swelling, mass, nipple discharge or tenderness.  Abdominal:     General: There is no distension.     Palpations: Abdomen is soft.     Tenderness: There is no abdominal tenderness.  Genitourinary:    General: Normal vulva.     Exam position: Lithotomy position.     Urethra: No prolapse.     Vagina: Normal. No vaginal discharge or bleeding.     Cervix: No lesion.     Adnexa: Right adnexa normal and left adnexa normal.     Comments: Cervix and uterus absent Musculoskeletal:        General: Normal range of motion.     Cervical back: Normal range of motion.  Lymphadenopathy:     Upper Body:     Right upper body: No axillary adenopathy.     Left upper body: No axillary adenopathy.     Lower Body: No right inguinal adenopathy. No left inguinal adenopathy.  Skin:    General: Skin is warm and dry.  Neurological:     General: No focal deficit present.     Mental Status: She is alert.  Psychiatric:        Mood and Affect: Mood normal.        Behavior: Behavior normal.      Assessment and Plan:        Well woman exam with routine gynecological exam Assessment & Plan: Cervical cancer screening performed according to ASCCP  guidelines. Encouraged annual mammogram screening Colonoscopy UTD DXA UTD, managed by PCP Labs and immunizations with her primary Encouraged safe sexual practices as indicated Encouraged healthy lifestyle practices with diet and exercise For patients under 50-70yo, I recommend 1200mg  calcium daily and 600IU of vitamin D daily.    Urinary urgency -     Urinalysis,Complete w/RFL Culture  Family history of ovarian cancer -     Ambulatory referral to Genetics  Family history of breast cancer  Other orders -     REFLEXIVE URINE CULTURE  Recommend genetics screening given significant family history of breast and ovarian cancer. No indication for oophorectomy without genetic basis of increased ovarian cancer risk. All questions answered.  Declines use of OAB medications at this time. Encouraged kegels.  Romaine Closs, MD

## 2023-09-29 NOTE — Assessment & Plan Note (Signed)
 Cervical cancer screening performed according to ASCCP guidelines. Encouraged annual mammogram screening Colonoscopy UTD DXA UTD, managed by PCP Labs and immunizations with her primary Encouraged safe sexual practices as indicated Encouraged healthy lifestyle practices with diet and exercise For patients under 50-67yo, I recommend 1200mg  calcium daily and 600IU of vitamin D daily.

## 2023-11-04 LAB — HM DIABETES EYE EXAM

## 2023-11-09 ENCOUNTER — Other Ambulatory Visit: Payer: Self-pay | Admitting: Family Medicine

## 2023-11-14 ENCOUNTER — Encounter: Payer: Self-pay | Admitting: Cardiology

## 2023-11-14 ENCOUNTER — Ambulatory Visit: Attending: Cardiology | Admitting: Cardiology

## 2023-11-14 VITALS — BP 147/85 | HR 81 | Ht 65.0 in | Wt 213.0 lb

## 2023-11-14 DIAGNOSIS — Z8249 Family history of ischemic heart disease and other diseases of the circulatory system: Secondary | ICD-10-CM | POA: Diagnosis not present

## 2023-11-14 DIAGNOSIS — E119 Type 2 diabetes mellitus without complications: Secondary | ICD-10-CM | POA: Insufficient documentation

## 2023-11-14 DIAGNOSIS — Z7982 Long term (current) use of aspirin: Secondary | ICD-10-CM | POA: Diagnosis not present

## 2023-11-14 DIAGNOSIS — E1165 Type 2 diabetes mellitus with hyperglycemia: Secondary | ICD-10-CM

## 2023-11-14 DIAGNOSIS — Z79899 Other long term (current) drug therapy: Secondary | ICD-10-CM | POA: Insufficient documentation

## 2023-11-14 DIAGNOSIS — E785 Hyperlipidemia, unspecified: Secondary | ICD-10-CM | POA: Insufficient documentation

## 2023-11-14 DIAGNOSIS — I10 Essential (primary) hypertension: Secondary | ICD-10-CM

## 2023-11-14 MED ORDER — ASPIRIN 81 MG PO TBEC: 81.0000 mg | DELAYED_RELEASE_TABLET | Freq: Every day | ORAL | Status: AC

## 2023-11-14 NOTE — Progress Notes (Signed)
 Cardiology Office Note:  .   Date:  11/14/2023  ID:  Deatra Face, DOB February 20, 1956, MRN [id number] PCP: Abram Abraham, NP-C  [facility] Providers Cardiologist:  Dorothye Gathers, MD     History of Present Illness: Aaron Aas   Gretchen Weinfeld is a 68 y.o. female Discussed the use of AI scribe software for clinical note transcription with the patient, who gave verbal consent to proceed.  History of Present Illness Kimeka Badour is a 68 year old female with hypertension and diabetes who presents for cardiovascular risk assessment due to family history of heart disease. She was referred by Geofm Killer for evaluation of family history of heart disease.  She is currently on statin therapy and takes atenolol  50 mg, losartan  25 mg, and hydrochlorothiazide  25 mg for hypertension. Her diabetes is well-controlled with a hemoglobin A1c of 6.4, managed through diet and exercise. Her current medications include atorvastatin  10 mg, which she has not adjusted since it was prescribed. Her last LDL was 87.  She reports a family history of heart disease, with her father having passed away at 65 from lung cancer, and her mother recently passing away from cancer. Her mother was once told she might have a heart murmur, but there is no confirmed history of heart disease.  No chest pain. She experiences shortness of breath when walking, which she attributes to being about 50 pounds overweight.      ROS: No bleeding  Studies Reviewed: Aaron Aas    EKG Interpretation Date/Time:  Monday November 14 2023 08:36:21 EDT Ventricular Rate:  81 PR Interval:  172 QRS Duration:  80 QT Interval:  366 QTC Calculation: 425 R Axis:   70  Text Interpretation: Normal sinus rhythm Nonspecific ST abnormality No previous ECGs available Confirmed by Dorothye Gathers ([id number]) on 11/14/2023 8:38:22 AM    Results LABS HbA1c: 6.4% LDL: 87 mg/dL  DIAGNOSTIC EKG: Normal (11/14/2023)  Risk Assessment/Calculations:            Physical Exam:   VS:  BP (!) 147/85   Pulse 81   Ht 5\' 5"  (1.651 m)   Wt 213 lb (96.6 kg)   SpO2 98%   BMI 35.45 kg/m    Wt Readings from Last 3 Encounters:  11/14/23 213 lb (96.6 kg)  09/29/23 211 lb (95.7 kg)  08/18/23 213 lb (96.6 kg)    GEN: Well nourished, well developed in no acute distress NECK: No JVD; No carotid bruits CARDIAC: RRR, no murmurs, no rubs, no gallops RESPIRATORY:  Clear to auscultation without rales, wheezing or rhonchi  ABDOMEN: Soft, non-tender, non-distended EXTREMITIES:  No edema; No deformity   ASSESSMENT AND PLAN: .    Assessment and Plan Assessment & Plan Hypertension Hypertension is managed with atenolol  50 mg, losartan  25 mg, and hydrochlorothiazide  25 mg. Blood pressure was slightly elevated today, likely due to the excitement of the office visit. - Advise regular home blood pressure monitoring in a quiet environment.  Hyperlipidemia Hyperlipidemia is managed with atorvastatin  10 mg. LDL cholesterol is 87 mg/dL, which is acceptable but could be improved to less than 70 mg/dL if there is evidence of plaque. Discussed the role of atorvastatin  in stabilizing plaque and preventing cardiovascular events. Consider increasing atorvastatin  dose or switching to rosuvastatin if coronary calcium  score indicates plaque presence. Explained the coronary calcium  score CT scan as a non-invasive screening tool to assess for calcified plaque, noting it costs $99 and is not covered by insurance. - Order coronary calcium   score CT scan to assess for calcified plaque. - Review coronary calcium  score results and adjust statin therapy if necessary. - Recheck cholesterol levels 2-3 months after any medication adjustment.  Type 2 diabetes mellitus without complications Type 2 diabetes is well-controlled with a hemoglobin A1c of 6.5, managed through diet and exercise. Discussed the potential benefit of low-dose aspirin for cardiovascular prevention in the context  of diabetes. Informed her of the bleeding risks associated with aspirin, including gastrointestinal irritation and serious bleeding signs to watch for. - Initiate low-dose aspirin 81 mg daily with food to minimize gastrointestinal irritation.         Dispo: We will follow up with results of testing.  Signed, Dorothye Gathers, MD

## 2023-11-14 NOTE — Patient Instructions (Addendum)
 Medication Instructions:  Please start Asiprin 81 mg a day. Continue all other medications as listed  *If you need a refill on your cardiac medications before your next appointment, please call your pharmacy*  Testing/Procedures: Your physician has requested that you have a Coronary Calcium  score which is completed by CT. Cardiac computed tomography (CT) is a painless test that uses an x-ray machine to take clear, detailed pictures of your heart. There are no instructions for this testing.  You may eat/drink and take your normal medications this day.  The cost of the testing is $99 due at the time of your appointment.  Follow-Up: At [facility], you and your health needs are our priority.  As part of our continuing mission to provide you with exceptional heart care, our providers are all part of one team.  This team includes your primary Cardiologist (physician) and Advanced Practice Providers or APPs (Physician Assistants and Nurse Practitioners) who all work together to provide you with the care you need, when you need it.  Your next appointment:   Follow up as needed based on the results of the above testing.  We recommend signing up for the patient portal called "MyChart".  Sign up information is provided on this After Visit Summary.  MyChart is used to connect with patients for Virtual Visits (Telemedicine).  Patients are able to view lab/test results, encounter notes, upcoming appointments, etc.  Non-urgent messages can be sent to your provider as well.   To learn more about what you can do with MyChart, go to [email or web address].   Thank you for choosing [facility]!!

## 2023-11-15 ENCOUNTER — Encounter: Payer: Self-pay | Admitting: Family Medicine

## 2023-11-21 ENCOUNTER — Inpatient Hospital Stay: Attending: Genetic Counselor | Admitting: Genetic Counselor

## 2023-11-21 ENCOUNTER — Inpatient Hospital Stay

## 2023-11-21 ENCOUNTER — Encounter: Payer: Self-pay | Admitting: Genetic Counselor

## 2023-11-21 DIAGNOSIS — Z803 Family history of malignant neoplasm of breast: Secondary | ICD-10-CM

## 2023-11-21 DIAGNOSIS — Z800 Family history of malignant neoplasm of digestive organs: Secondary | ICD-10-CM

## 2023-11-21 DIAGNOSIS — Z8041 Family history of malignant neoplasm of ovary: Secondary | ICD-10-CM | POA: Insufficient documentation

## 2023-11-21 DIAGNOSIS — Z1379 Encounter for other screening for genetic and chromosomal anomalies: Secondary | ICD-10-CM | POA: Insufficient documentation

## 2023-11-21 LAB — GENETIC SCREENING ORDER

## 2023-11-21 NOTE — Progress Notes (Signed)
 REFERRING PROVIDER: Romaine Closs, MD [address] [place],  [place] [address]  PRIMARY PROVIDER:  Abram Abraham, NP-C  PRIMARY REASON FOR VISIT:  1. Family history of malignant neoplasm of breast   2. Family history of malignant neoplasm of gastrointestinal tract   3. Family history of malignant neoplasm of ovary     HISTORY OF PRESENT ILLNESS:   Ms. Spengler, a 68 y.o. female, was seen for a [facility] cancer genetics consultation at the request of Dr. Andrena Ke due to a family history of cancer.  Ms. Ornstein presents to clinic today to discuss the possibility of a hereditary predisposition to cancer, genetic testing, and to further clarify her future cancer risks, as well as potential cancer risks for family members.   Ms. Steffenhagen is a 68 y.o. female with no personal history of cancer.    RELEVANT MEDICAL HISTORY:  Menarche was at age 35.  First live birth at age 74.  OCP use for approximately 11 years.  Ovaries intact: yes.  Hysterectomy: yes. At age 42 due to fibroids  Menopausal status: postmenopausal.  HRT use: 0 years. Colonoscopy: yes; 07/2021, 1 tubular adenoma, 2 sessile serrated polyps . Mammogram within the last year: yes. Density B Number of breast biopsies: 0.  Past Medical History:  Diagnosis Date   Diabetes mellitus without complication (HCC)    controlling with diet   Elevated ALT measurement    Hyperlipidemia    Hypertension    Prediabetes     Past Surgical History:  Procedure Laterality Date   CATARACT EXTRACTION  2021   INDUCED ABORTION     PARTIAL HYSTERECTOMY     TUBAL LIGATION      Social History   Socioeconomic History   Marital status: Married    Spouse name: Not on file   Number of children: Not on file   Years of education: Not on file   Highest education level: Some college, no degree  Occupational History   Not on file  Tobacco Use   Smoking status: Never   Smokeless tobacco: Never  Vaping Use   Vaping status:  Never Used  Substance and Sexual Activity   Alcohol use: Yes    Alcohol/week: 1.0 standard drink of alcohol    Types: 1 Standard drinks or equivalent per week    Comment: occassional   Drug use: No   Sexual activity: Yes  Other Topics Concern   Not on file  Social History Narrative   Not on file   Social Drivers of Health   Financial Resource Strain: Low Risk  (08/18/2023)   Overall Financial Resource Strain (CARDIA)    Difficulty of Paying Living Expenses: Not very hard  Food Insecurity: No Food Insecurity (08/18/2023)   Hunger Vital Sign    Worried About Running Out of Food in the Last Year: Never true    Ran Out of Food in the Last Year: Never true  Transportation Needs: No Transportation Needs (08/18/2023)   PRAPARE - Administrator, Civil Service (Medical): No    Lack of Transportation (Non-Medical): No  Physical Activity: Inactive (08/18/2023)   Exercise Vital Sign    Days of Exercise per Week: 0 days    Minutes of Exercise per Session: 30 min  Stress: Stress Concern Present (08/18/2023)   [employer] of Occupational Health - Occupational Stress Questionnaire    Feeling of Stress : To some extent  Social Connections: Moderately Integrated (08/18/2023)   Social  Connection and Isolation Panel [NHANES]    Frequency of Communication with Friends and Family: Three times a week    Frequency of Social Gatherings with Friends and Family: Once a week    Attends Religious Services: Never    Database administrator or Organizations: No    Attends Engineer, structural: More than 4 times per year    Marital Status: Married     FAMILY HISTORY:  We obtained a detailed, 4-generation family history.  Significant diagnoses are listed below: Family History  Problem Relation Age of Onset   Ovarian cancer Mother 22   Breast cancer Mother 53 - 42   Hypertension Mother    Rheum arthritis Mother    Thyroid disease Mother    Vaginal cancer Mother 44       vulvar    Colon cancer Mother 70   Lung cancer Father 81   Colon polyps Brother    Heart disease Brother    Colitis Brother    COPD Brother    Diabetes Brother    Breast cancer Maternal Aunt 60   Breast cancer Maternal Aunt 64   Cancer Paternal Aunt    Alcoholism Daughter    Hepatitis C Daughter    Alcoholism Son    Breast cancer Cousin 84   Breast cancer Cousin 50 - 46   Melanoma Cousin 70   Melanoma Cousin 50 - 59   Penile cancer Cousin    Esophageal cancer Neg Hx    Rectal cancer Neg Hx    Stomach cancer Neg Hx     Ms. Ericsson is unaware of previous family history of genetic testing for hereditary cancer risks. Patient's maternal ancestors are of [place] descent, and paternal ancestors are of [place] descent. There is no reported Ashkenazi Jewish ancestry.   Ms. Clifton reports her mother was diagnosed with breast cancer in her mid 71s, vulvar and colon cancer in her early 46s and ovarian cancer at age 50, deceased at 80. She reports two maternal aunts diagnosed with breast cancer, one in her 71s and one in her 76s. She reports two maternal first cousins diagnosed with breast cancer, one in her 27s and one in her 14s. She reports two maternal first cousins diagnosed with melanoma, one with melanoma on his ear in his 27s or 42s, and one with melanoma on his nose in his 36s. She reports a maternal first cousin diagnosed with penile cancer that passed away at age 57. She reports her father was diagnosed with lung cancer at 52 and passed away at age 8, history of cigarette use. She reports a paternal aunt diagnosed with an unknown cancer.     GENETIC COUNSELING ASSESSMENT: Ms. Opperman is a 68 y.o. female with a family history of cancer which is suggestive of an inherited predisposition to cancer given the history of a first degree relative with breast and ovarian cancer and a total of five maternal relatives with breast cancer. We, therefore, discussed and recommended the following at today's visit.    DISCUSSION: We discussed that, in general, most cancer is not inherited in families, but instead is sporadic or familial. Sporadic cancers occur by chance and typically happen at older ages (>50 years) as this type of cancer is caused by genetic changes acquired during an individual's lifetime. Some families have more cancers than would be expected by chance; however, the ages or types of cancer are not consistent with a known genetic mutation or known genetic mutations have  been ruled out. This type of familial cancer is thought to be due to a combination of multiple genetic, environmental, hormonal, and lifestyle factors. While this combination of factors likely increases the risk of cancer, the exact source of this risk is not currently identifiable or testable.  We discussed that 5 - 10% of cancer is hereditary. We discussed that testing is beneficial for several reasons including knowing how to screen for cancer, identify preventative treatments, and to understand if other family members could be at risk for cancer and allow them to undergo genetic testing.   We reviewed the characteristics, features and inheritance patterns of hereditary cancer syndromes. We also discussed genetic testing, including the appropriate family members to test, the process of testing, insurance coverage and turn-around-time for results. We discussed the implications of a negative, positive, carrier and/or variant of uncertain significant result. Ms. Matthes  was offered a common hereditary cancer panel (36+ genes) and an expanded pan-cancer panel (70+ genes). Ms. Hoggard was informed of the benefits and limitations of each panel, including that expanded pan-cancer panels contain genes that do not have clear management guidelines at this point in time.  We also discussed that as the number of genes included on a panel increases, the chances of variants of uncertain significance increases. Ms. Pullin decided to pursue genetic  testing for the 40 gene panel. The [facility] CancerNext+RNAinsight Panel includes sequencing, rearrangement analysis, and RNA analysis for the following 40 genes: APC, ATM, BAP1, BARD1, BMPR1A, BRCA1, BRCA2, BRIP1, CDH1, CDKN2A, CHEK2, FH, FLCN, MET, MLH1, MSH2, MSH6, MUTYH, NF1, NTHL1, PALB2, PMS2, PTEN, RAD51C, RAD51D, RPS20, SMAD4, STK11, TP53, TSC1, TSC2 and VHL (sequencing and deletion/duplication); AXIN2, HOXB13, MBD4, MSH3, POLD1 and POLE (sequencing only); EPCAM and GREM1 (deletion/duplication only).   Based on Ms. Aguilar's family history of cancer, she meets medical criteria for genetic testing. Though Ms. Eifler is not personally affected, there are no affected family members that are willing/able/available to undergo hereditary cancer testing.  Therefore, Ms. Whitworth the most informative family member available.  Despite that she meets criteria, she may still have an out of pocket cost. We discussed that if her out of pocket cost for testing is over $100, the laboratory will call and confirm whether she wants to proceed with testing.  If the out of pocket cost of testing is less than $100 she will be billed by the genetic testing laboratory.   We discussed that some people do not want to undergo genetic testing due to fear of genetic discrimination.  The Genetic Information Nondiscrimination Act (GINA) was signed into federal law in 2008. GINA prohibits health insurers and most employers from discriminating against individuals based on genetic information (including the results of genetic tests and family history information). According to GINA, health insurance companies cannot consider genetic information to be a preexisting condition, nor can they use it to make decisions regarding coverage or rates. GINA also makes it illegal for most employers to use genetic information in making decisions about hiring, firing, promotion, or terms of employment. It is important to note that GINA does not offer  protections for life insurance, disability insurance, or long-term care insurance. GINA does not apply to those in the [employer], those who work for companies with less than 15 employees, and new life insurance or long-term disability insurance policies.  Health status due to a cancer diagnosis is not protected under GINA. More information about GINA can be found by visiting [email or web address].  The Tyrer-Cuzick model is one of  multiple prediction models developed to estimate an individual's lifetime risk of developing breast cancer. The Tyrer-Cuzick model is endorsed by the [employer] (NCCN). This model includes many risk factors such as family history, endogenous estrogen exposure, and benign breast disease. The calculation is highly-dependent on the accuracy of clinical data provided by the patient and can change over time. The Tyrer-Cuzick model may be repeated to reflect new information in her personal or family history in the future. Will be run and discussed with return of results.   PLAN: After considering the risks, benefits, and limitations, Ms. Zee provided informed consent to pursue genetic testing and the blood sample was sent to [facility] for analysis of the CancerNext +RNAinsight test. Results should be available within approximately 2-3 weeks' time, at which point they will be disclosed by telephone to Ms. Annabell Key, as will any additional recommendations warranted by these results. Ms. Rickles will receive a summary of her genetic counseling visit and a copy of her results once available. This information will also be available in Epic.   Lastly, we encouraged Ms. Ruta to remain in contact with cancer genetics annually so that we can continuously update the family history and inform her of any changes in cancer genetics and testing that may be of benefit for this family.   Ms. Giacobbe questions were answered to her satisfaction today. Our contact  information was provided should additional questions or concerns arise. Thank you for the referral and allowing us  to share in the care of your patient.   Jobie Mulders, MS, [facility] Licensed, Retail [email or web address] phone: [phone]  70 minutes were spent on the date of the encounter in service to the patient including preparation, face-to-face consultation, documentation and care coordination.  The patient was seen alone.  Drs. Johnna Nakai, and/or Gudena were available for questions, if needed..    _______________________________________________________________________ For Office Staff:  Number of people involved in session: 1 Was an Intern/ student involved with case: yes. [facility] Intern Moira Andrews provided counseling in this visit under direct supervision by me.

## 2023-12-07 ENCOUNTER — Telehealth: Payer: Self-pay | Admitting: Genetic Counselor

## 2023-12-07 NOTE — Telephone Encounter (Signed)
 LVM asking for call back to review results of genetic testing.

## 2023-12-09 ENCOUNTER — Ambulatory Visit: Payer: Self-pay | Admitting: Genetic Counselor

## 2023-12-09 DIAGNOSIS — Z1379 Encounter for other screening for genetic and chromosomal anomalies: Secondary | ICD-10-CM

## 2023-12-09 NOTE — Telephone Encounter (Signed)
 I spoke to Brittany Mooney to review results of genetic testing. she had genetic testing with [facility] CancerNext +RNAinsight panel. Testing did not identify any variants known to increase the risk for cancer.  Discussed that we do not know why there is cancer in the family. It could be due to a different gene that we are not testing, or maybe our current technology may not be able to pick something up.  It will be important for her to keep in contact with genetics to keep up with whether additional testing may be needed.  Did run statistical models to calculate breast cancer risk. Lifetime TC risk 13.3%, 5-year Gail risk 3%. Brittany Mooney may have an increased risk for breast cancer. NCCN recommends that women with a 5-year risk for breast cancer over 1.7% per Alisa model consider:  Clinical encounter every 6-12 months, to begin when increased risk is identified  Annual screening mammogram with tomosynthesis  To begin when identified as being at increased risk  Consider risk reduction strategies, such as medication to decrease the risk for breast cancer  Breast awareness   Offered referral to High Risk Breast Cancer clinic. Patient declined.   Please see counseling note for further detail on this result.

## 2023-12-12 DIAGNOSIS — Z1379 Encounter for other screening for genetic and chromosomal anomalies: Secondary | ICD-10-CM | POA: Insufficient documentation

## 2023-12-12 NOTE — Progress Notes (Signed)
 HPI:  Brittany Mooney was previously seen in the [facility] due to a family history of cancer and concerns regarding a hereditary predisposition to cancer. Please refer to our prior cancer genetics clinic note for more information regarding our discussion, assessment and recommendations, at the time. Brittany Mooney recent genetic test results were disclosed to her, as were recommendations warranted by these results. These results and recommendations are discussed in more detail below.  Results disclosed by phone on 12/09/23.   FAMILY HISTORY:  We obtained a detailed, 4-generation family history.  Significant diagnoses are listed below: Family History  Problem Relation Age of Onset   Ovarian cancer Mother 32   Breast cancer Mother 27 - 54   Hypertension Mother    Rheum arthritis Mother    Thyroid disease Mother    Vaginal cancer Mother 27       vulvar   Colon cancer Mother 47   Lung cancer Father 47   Colon polyps Brother    Heart disease Brother    Colitis Brother    COPD Brother    Diabetes Brother    Breast cancer Maternal Aunt 60   Breast cancer Maternal Aunt 3   Cancer Paternal Aunt    Alcoholism Daughter    Hepatitis C Daughter    Alcoholism Son    Breast cancer Cousin 30   Breast cancer Cousin 50 - 74   Melanoma Cousin 70   Melanoma Cousin 50 - 59   Penile cancer Cousin    Esophageal cancer Neg Hx    Rectal cancer Neg Hx    Stomach cancer Neg Hx     Ms. Frasier is unaware of previous family history of genetic testing for hereditary cancer risks. Patient's maternal ancestors are of [place] descent, and paternal ancestors are of [place] descent. There is no reported Ashkenazi Jewish ancestry.    Ms. Shoaff reports her mother was diagnosed with breast cancer in her mid 74s, vulvar and colon cancer in her early 45s and ovarian cancer at age 13, deceased at 31. She reports two maternal aunts diagnosed with breast cancer, one in her 75s and one in her 47s. She  reports two maternal first cousins diagnosed with breast cancer, one in her 76s and one in her 66s. She reports two maternal first cousins diagnosed with melanoma, one with melanoma on his ear in his 66s or 56s, and one with melanoma on his nose in his 109s. She reports a maternal first cousin diagnosed with penile cancer that passed away at age 81. She reports her father was diagnosed with lung cancer at 42 and passed away at age 27, history of cigarette use. She reports a paternal aunt diagnosed with an unknown cancer.        GENETIC TEST RESULTS: Genetic testing reported out on 12/02/23 through the [facility] +RNAinsight panel found no pathogenic mutations. The [facility]RNAinsight Panel includes sequencing, rearrangement analysis, and RNA analysis for the following 40 genes: APC, ATM, BAP1, BARD1, BMPR1A, BRCA1, BRCA2, BRIP1, CDH1, CDKN2A, CHEK2, FH, FLCN, MET, MLH1, MSH2, MSH6, MUTYH, NF1, NTHL1, PALB2, PMS2, PTEN, RAD51C, RAD51D, RPS20, SMAD4, STK11, TP53, TSC1, TSC2 and VHL (sequencing and deletion/duplication); AXIN2, HOXB13, MBD4, MSH3, POLD1 and POLE (sequencing only); EPCAM and GREM1 (deletion/duplication only). The test report has been scanned into EPIC and is located under the Molecular Pathology section of the Results Review tab.  A portion of the result report is included below for reference.     We  discussed with Brittany Mooney that because current genetic testing is not perfect, it is possible there may be a gene mutation in one of these genes that current testing cannot detect, but that chance is small.  We also discussed, that there could be another gene that has not yet been discovered, or that we have not yet tested, that is responsible for the cancer diagnoses in the family. It is also possible there is a hereditary cause for the cancer in the family that Brittany Mooney did not inherit and therefore was not identified in her testing.  Therefore, it is important to remain in touch  with cancer genetics in the future so that we can continue to offer Brittany Mooney the most up to date genetic testing.   ADDITIONAL GENETIC TESTING: We discussed with Brittany Mooney that there are other genes that are associated with increased cancer risk that can be analyzed. Should Brittany Mooney wish to pursue additional genetic testing, we are happy to discuss and coordinate this testing, at any time.    CANCER SCREENING RECOMMENDATIONS: Brittany Mooney test result is considered negative (normal).  This means that we have not identified a hereditary cause for her family history of cancer at this time. Most cancers happen by chance and this negative test suggests that her family history of cancer may fall into this category.    Possible reasons for Brittany Mooney's negative genetic test include:  1. There may be a gene mutation in one of these genes that current testing methods cannot detect but that chance is small.  2. There could be another gene that has not yet been discovered, or that we have not yet tested, that is responsible for the cancer diagnoses in the family.  3.  There may be no hereditary risk for cancer in the family. The cancers in Brittany Mooney and/or her family may be sporadic/familial or due to other genetic and environmental factors. 4. It is also possible there is a hereditary cause for the cancer in the family that Brittany Mooney did not inherit.  Therefore, it is recommended she continue to follow the cancer management and screening guidelines provided by her primary healthcare provider. An individual's cancer risk and medical management are not determined by genetic test results alone. Overall cancer risk assessment incorporates additional factors, including personal medical history, family history, and any available genetic information that may result in a personalized plan for cancer prevention and surveillance  Given Brittany Mooney's family histories, we must interpret these negative results with  some caution.  Families with features suggestive of hereditary risk for cancer tend to have multiple family members with cancer, diagnoses in multiple generations and diagnoses before the age of 57. Brittany Mooney family exhibits some of these features. Thus, this result may simply reflect our current inability to detect all mutations within these genes or there may be a different gene that has not yet been discovered or tested.   An individual's cancer risk and medical management are not determined by genetic test results alone. Overall cancer risk assessment incorporates additional factors, including personal medical history, family history, and any available genetic information that may result in a personalized plan for cancer prevention and surveillance.  The Tyrer-Cuzick and Alisa models are two of multiple prediction models developed to estimate an individual's risk of developing breast cancer. Both models are endorsed by the [employer] (NCCN). Models include many risk factors such as family history, endogenous estrogen exposure, and benign breast disease. The  calculation is highly-dependent on the accuracy of clinical data provided by the patient and can change over time. Models may be repeated to reflect new information in her personal or family history in the future.   Based on Brittany Mooney's history, a Beatrice Harvey was used to estimate her risk of developing breast cancer. This estimates her lifetime risk of developing breast cancer to be approximately 13.3%. NCCN recommends consideration of breast MRI screening as an adjunct to mammography for patients at high risk (defined as 20% or greater lifetime risk). Please note that a woman's breast cancer risk changes over time. It may increase or decrease based on age and any changes to the personal and/or family medical history. The risks and recommendations listed above apply to this patient at this point in time. In the future, she  may or may not be eligible for the same medical management strategies and, in some cases, other medical management strategies may become available to her. If she is interested in an updated breast cancer risk assessment at a later date, she can contact us .  Brittany Mooney may have an increased risk for breast cancer based on her family history. The Alisa model has calculated her 5 year risk for breast cancer to be 3.0%. NCCN recommends that women with a 5-year risk for breast cancer over 1.7% per Alisa model:  Clinical encounter every 6-12 months, to begin when increased risk is identified  Annual screening mammogram with tomosynthesis  To begin when identified as being at increased risk  Consider risk reduction strategies, such as medication to decrease the risk for breast cancer  Breast awareness     We, therefore, discussed that it is reasonable for Brittany Mooney to be followed by a high-risk breast cancer clinic; in addition to a yearly mammogram and physical exam by a healthcare provider, she should discuss the usefulness of risk reducing medication with the high-risk clinic providers. Brittany Mooney declined a referral at this time.   RECOMMENDATIONS FOR FAMILY MEMBERS:  Individuals in this family might be at some increased risk of developing cancer, over the general population risk, simply due to the family history of cancer.  We recommended women in this family have a yearly mammogram beginning at age 43, or 4 years younger than the earliest onset of cancer, an annual clinical breast exam, and perform monthly breast self-exams. Women in this family should also have a gynecological exam as recommended by their primary provider. All family members should be referred for colonoscopy starting at age 85, or 9 years younger than the earliest onset of cancer.   It is also possible there is a hereditary cause for the cancer in Brittany Mooney's family that she did not inherit and therefore was not identified in her.   Based on Ms. Jeanpaul's family history, we recommended her family members that have been diagnosed with cancer have genetic counseling and testing.   FOLLOW-UP: Lastly, we discussed with Ms. Poteet that cancer genetics is a rapidly advancing field and it is possible that new genetic tests will be appropriate for her and/or her family members in the future. We encouraged her to remain in contact with cancer genetics on an annual basis so we can update her personal and family histories and let her know of advances in cancer genetics that may benefit this family.   Our contact number was provided. Ms. Hyden questions were answered to her satisfaction, and she knows she is welcome to call us  at anytime with additional questions  or concerns.   Burnard Ogren, MS, [facility] Licensed, Retail [email or web address] [phone]

## 2023-12-22 ENCOUNTER — Ambulatory Visit (HOSPITAL_BASED_OUTPATIENT_CLINIC_OR_DEPARTMENT_OTHER)
Admission: RE | Admit: 2023-12-22 | Discharge: 2023-12-22 | Disposition: A | Discharge status: Home / Self Care | Payer: Self-pay | Source: Ambulatory Visit | Attending: Cardiology | Admitting: Cardiology

## 2023-12-22 DIAGNOSIS — Z8249 Family history of ischemic heart disease and other diseases of the circulatory system: Secondary | ICD-10-CM | POA: Insufficient documentation

## 2023-12-22 DIAGNOSIS — E1165 Type 2 diabetes mellitus with hyperglycemia: Secondary | ICD-10-CM | POA: Insufficient documentation

## 2023-12-22 DIAGNOSIS — I10 Essential (primary) hypertension: Secondary | ICD-10-CM | POA: Insufficient documentation

## 2023-12-27 ENCOUNTER — Ambulatory Visit: Payer: Self-pay | Admitting: Cardiology

## 2023-12-27 DIAGNOSIS — Z79899 Other long term (current) drug therapy: Secondary | ICD-10-CM

## 2023-12-27 DIAGNOSIS — E1169 Type 2 diabetes mellitus with other specified complication: Secondary | ICD-10-CM

## 2024-01-16 MED ORDER — ROSUVASTATIN CALCIUM 20 MG PO TABS
20.0000 mg | ORAL_TABLET | Freq: Every day | ORAL | 3 refills | Status: AC
Start: 2024-01-16 — End: ?

## 2024-02-01 ENCOUNTER — Ambulatory Visit: Payer: Medicare Other

## 2024-02-07 ENCOUNTER — Other Ambulatory Visit: Payer: Self-pay | Admitting: Family Medicine

## 2024-02-07 DIAGNOSIS — I10 Essential (primary) hypertension: Secondary | ICD-10-CM

## 2024-02-23 ENCOUNTER — Ambulatory Visit: Admitting: Family Medicine

## 2024-03-07 ENCOUNTER — Ambulatory Visit (INDEPENDENT_AMBULATORY_CARE_PROVIDER_SITE_OTHER)

## 2024-03-07 VITALS — Ht 65.0 in | Wt 208.0 lb

## 2024-03-07 DIAGNOSIS — Z0000 Encounter for general adult medical examination without abnormal findings: Secondary | ICD-10-CM | POA: Diagnosis not present

## 2024-03-07 NOTE — Progress Notes (Signed)
 Subjective:   Brittany Mooney is a 68 y.o. who presents for a Medicare Wellness preventive visit.  As a reminder, Annual Wellness Visits don't include a physical exam, and some assessments may be limited, especially if this visit is performed virtually. We may recommend an in-person follow-up visit with your provider if needed.  Visit Complete: Virtual I connected with  [name] on 03/07/24 by a audio enabled telemedicine application and verified that I am speaking with the correct person using two identifiers.  Patient Location: Home  Provider Location: Office/Clinic  I discussed the limitations of evaluation and management by telemedicine. The patient expressed understanding and agreed to proceed.  Vital Signs: Because this visit was a virtual/telehealth visit, some criteria may be missing or patient reported. Any vitals not documented were not able to be obtained and vitals that have been documented are patient reported.  VideoDeclined- This patient declined Librarian, academic. Therefore the visit was completed with audio only.  Persons Participating in Visit: Patient.  AWV Questionnaire: No: Patient Medicare AWV questionnaire was not completed prior to this visit.  Cardiac Risk Factors include: advanced age (>54men, >59 women);diabetes mellitus;dyslipidemia;hypertension;obesity (BMI >30kg/m2)     Objective:    Today's Vitals   03/07/24 1012  Weight: 208 lb (94.3 kg)  Height: 5' 5 (1.651 m)   Body mass index is 34.61 kg/m.     03/07/2024   10:22 AM 01/31/2023   10:04 AM  Advanced Directives  Does Patient Have a Medical Advance Directive? No No  Would patient like information on creating a medical advance directive? Yes (MAU/Ambulatory/Procedural Areas - Information given) No - Patient declined    Current Medications (verified) Outpatient Encounter Medications as of 03/07/2024  Medication Sig   aspirin  EC 81 MG tablet Take  1 tablet (81 mg total) by mouth daily. Swallow whole.   atenolol  (TENORMIN ) 50 MG tablet TAKE 1 TABLET DAILY   diphenhydrAMINE (BENADRYL) 25 MG tablet Take 25 mg by mouth every 6 (six) hours as needed.   hydrochlorothiazide  (HYDRODIURIL ) 25 MG tablet TAKE 1 TABLET DAILY   losartan  (COZAAR ) 25 MG tablet TAKE 1 TABLET DAILY   rosuvastatin  (CRESTOR ) 20 MG tablet Take 1 tablet (20 mg total) by mouth daily.   Blood Glucose Monitoring Suppl DEVI May substitute to any manufacturer covered by patient's insurance.   Glucose Blood (BLOOD GLUCOSE TEST STRIPS) STRP May substitute to any manufacturer covered by patient's insurance. Use to check blood sugars 2 times daily.   Lancet Device MISC May substitute to any manufacturer covered by patient's insurance.   Lancets Misc. MISC May substitute to any manufacturer covered by patient's insurance. Use to check blood sugars 2 times daily.   Facility-Administered Encounter Medications as of 03/07/2024  Medication   0.9 %  sodium chloride  infusion    Allergies (verified) Patient has no known allergies.   History: Past Medical History:  Diagnosis Date   Diabetes mellitus without complication (HCC)    controlling with diet   Elevated ALT measurement    Hyperlipidemia    Hypertension    Prediabetes    Past Surgical History:  Procedure Laterality Date   CATARACT EXTRACTION  2021   INDUCED ABORTION     PARTIAL HYSTERECTOMY     TUBAL LIGATION     Family History  Problem Relation Age of Onset   Ovarian cancer Mother 27   Breast cancer Mother 53 - 30   Hypertension Mother    Rheum arthritis Mother  Thyroid disease Mother    Vaginal cancer Mother 76       vulvar   Colon cancer Mother 33   Lung cancer Father 49   Colon polyps Brother    Heart disease Brother    Colitis Brother    COPD Brother    Diabetes Brother    Breast cancer Maternal Aunt 60   Breast cancer Maternal Aunt 66   Cancer Paternal Aunt    Alcoholism Daughter    Hepatitis C  Daughter    Alcoholism Son    Breast cancer Cousin 60   Breast cancer Cousin 84 - 24   Melanoma Cousin 70   Melanoma Cousin 50 - 59   Penile cancer Cousin    Esophageal cancer Neg Hx    Rectal cancer Neg Hx    Stomach cancer Neg Hx    Social History   Socioeconomic History   Marital status: Married    Spouse name: Not on file   Number of children: Not on file   Years of education: Not on file   Highest education level: Some college, no degree  Occupational History   Not on file  Tobacco Use   Smoking status: Never   Smokeless tobacco: Never  Vaping Use   Vaping status: Never Used  Substance and Sexual Activity   Alcohol use: Yes    Alcohol/week: 1.0 standard drink of alcohol    Types: 1 Standard drinks or equivalent per week    Comment: occassional   Drug use: No   Sexual activity: Yes  Other Topics Concern   Not on file  Social History Narrative   Married   Social Drivers of Health   Financial Resource Strain: Low Risk  (03/07/2024)   Overall Financial Resource Strain (CARDIA)    Difficulty of Paying Living Expenses: Not hard at all  Food Insecurity: No Food Insecurity (03/07/2024)   Hunger Vital Sign    Worried About Running Out of Food in the Last Year: Never true    Ran Out of Food in the Last Year: Never true  Transportation Needs: No Transportation Needs (03/07/2024)   PRAPARE - Administrator, Civil Service (Medical): No    Lack of Transportation (Non-Medical): No  Physical Activity: Inactive (03/07/2024)   Exercise Vital Sign    Days of Exercise per Week: 0 days    Minutes of Exercise per Session: 0 min  Stress: No Stress Concern Present (03/07/2024)   [employer] of Occupational Health - Occupational Stress Questionnaire    Feeling of Stress: Only a little  Recent Concern: Stress - Stress Concern Present (02/21/2024)   [employer] of Occupational Health - Occupational Stress Questionnaire    Feeling of Stress: To some extent   Social Connections: Moderately Integrated (03/07/2024)   Social Connection and Isolation Panel    Frequency of Communication with Friends and Family: More than three times a week    Frequency of Social Gatherings with Friends and Family: Once a week    Attends Religious Services: 1 to 4 times per year    Active Member of [employer] or Organizations: No    Attends Engineer, structural: Never    Marital Status: Married    Tobacco Counseling Counseling given: Not Answered    Clinical Intake:  Pre-visit preparation completed: Yes  Pain : No/denies pain     BMI - recorded: 34.61 Nutritional Status: BMI > 30  Obese Nutritional Risks: None Diabetes: Yes CBG done?: Yes CBG  resulted in Enter/ Edit results?: Yes (fasting -- 132) Did pt. bring in CBG monitor from home?: No  Lab Results  Component Value Date   HGBA1C 6.5 08/18/2023   HGBA1C 6.4 08/19/2022   HGBA1C 6.5 04/08/2022     How often do you need to have someone help you when you read instructions, pamphlets, or other written materials from your doctor or pharmacy?: 1 - Never  Interpreter Needed?: No  Information entered by :: Verdie Saba, CMA   Activities of Daily Living     03/07/2024   10:15 AM  In your present state of health, do you have any difficulty performing the following activities:  Hearing? 0  Vision? 0  Difficulty concentrating or making decisions? 0  Walking or climbing stairs? 0  Dressing or bathing? 0  Doing errands, shopping? 0  Preparing Food and eating ? N  Using the Toilet? N  In the past six months, have you accidently leaked urine? Y  Comment wears a pantyliner  Do you have problems with loss of bowel control? N  Managing your Medications? N  Managing your Finances? N  Housekeeping or managing your Housekeeping? N    Patient Care Team: Lendia Boby CROME, NP-C as PCP - General (Family Medicine) Jeffrie Oneil BROCKS, MD as PCP - Cardiology (Cardiology) [facility], [place] as  Consulting Physician (Optometry)  I have updated your Care Teams any recent Medical Services you may have received from other providers in the past year.     Assessment:   This is a routine wellness examination for Jaloni.  Hearing/Vision screen Hearing Screening - Comments:: Denies hearing difficulties   Vision Screening - Comments:: Wears rx glasses - up to date with routine eye exams with [facility]   Goals Addressed               This Visit's Progress     Patient Stated (pt-stated)        Patient stated she plans to continue to monitor sugar intake and weight       Depression Screen     03/07/2024   10:16 AM 09/29/2023    2:45 PM 08/18/2023    8:29 AM 01/31/2023   10:08 AM 08/19/2022    8:41 AM 09/03/2021   10:16 AM 03/09/2021   10:04 AM  PHQ 2/9 Scores  PHQ - 2 Score 0 0 0 2 2 0 1  PHQ- 9 Score 0   5 5      Fall Risk     03/07/2024   10:15 AM 09/29/2023    2:45 PM 08/18/2023    8:29 AM 01/31/2023   10:05 AM 08/19/2022    8:41 AM  Fall Risk   Falls in the past year? 0 0 0 0 0  Number falls in past yr: 0 0 0 0 0  Injury with Fall? 0 0 0 0 0  Risk for fall due to : No Fall Risks No Fall Risks No Fall Risks No Fall Risks No Fall Risks  Follow up Falls evaluation completed;Falls prevention discussed Falls evaluation completed Falls evaluation completed Falls prevention discussed Falls evaluation completed    MEDICARE RISK AT HOME:  Medicare Risk at Home Any stairs in or around the home?: No If so, are there any without handrails?: No Home free of loose throw rugs in walkways, pet beds, electrical cords, etc?: Yes Adequate lighting in your home to reduce risk of falls?: Yes Life alert?: No Use of a  cane, walker or w/c?: No Grab bars in the bathroom?: Yes Shower chair or bench in shower?: Yes Elevated toilet seat or a handicapped toilet?: Yes  TIMED UP AND GO:  Was the test performed?  No  Cognitive Function: 6CIT completed        03/07/2024   10:18 AM  01/31/2023   10:07 AM  6CIT Screen  What Year? 0 points 0 points  What month? 0 points 0 points  What time? 0 points 0 points  Count back from 20 0 points 0 points  Months in reverse 0 points 0 points  Repeat phrase 0 points 0 points  Total Score 0 points 0 points    Immunizations Immunization History  Administered Date(s) Administered   Influenza,inj,Quad PF,6+ Mos 03/22/2017, 03/21/2018, 03/12/2019, 04/03/2020, 03/09/2021, 04/08/2022   Influenza-Unspecified 04/19/2023   PFIZER(Purple Top)SARS-COV-2 Vaccination 09/06/2019, 09/27/2019, 04/03/2020   PNEUMOCOCCAL CONJUGATE-20 08/19/2022   Pfizer Covid-19 Vaccine Bivalent Booster 72yrs & up 03/09/2021   Tdap 10/21/2016   Unspecified SARS-COV-2 Vaccination 04/19/2023    Screening Tests Health Maintenance  Topic Date Due   FOOT EXAM  08/19/2023   Influenza Vaccine  01/13/2024   COVID-19 Vaccine (6 - Pfizer risk 2024-25 season) 02/13/2024   HEMOGLOBIN A1C  02/18/2024   Diabetic kidney evaluation - eGFR measurement  08/17/2024   Diabetic kidney evaluation - Urine ACR  08/17/2024   OPHTHALMOLOGY EXAM  11/03/2024   Medicare Annual Wellness (AWV)  03/07/2025   Mammogram  06/29/2025   DTaP/Tdap/Td (2 - Td or Tdap) 10/22/2026   Colonoscopy  08/12/2031   Pneumococcal Vaccine: 50+ Years  Completed   DEXA SCAN  Completed   Hepatitis C Screening  Completed   HPV VACCINES  Aged Out   Meningococcal B Vaccine  Aged Out   Zoster Vaccines- Shingrix  Discontinued    Health Maintenance Items Addressed:  03/07/2024  Additional Screening:  Vision Screening: Recommended annual ophthalmology exams for early detection of glaucoma and other disorders of the eye. Is the patient up to date with their annual eye exam?  Yes  Who is the provider or what is the name of the office in which the patient attends annual eye exams? [facility]  Dental Screening: Recommended annual dental exams for proper oral hygiene  Community Resource Referral /  Chronic Care Management: CRR required this visit?  No   CCM required this visit?  No   Plan:    I have personally reviewed and noted the following in the patient's chart:   Medical and social history Use of alcohol, tobacco or illicit drugs  Current medications and supplements including opioid prescriptions. Patient is not currently taking opioid prescriptions. Functional ability and status Nutritional status Physical activity Advanced directives List of other physicians Hospitalizations, surgeries, and ER visits in previous 12 months Vitals Screenings to include cognitive, depression, and falls Referrals and appointments  In addition, I have reviewed and discussed with patient certain preventive protocols, quality metrics, and best practice recommendations. A written personalized care plan for preventive services as well as general preventive health recommendations were provided to patient.   Verdie CHRISTELLA Saba, CMA   03/07/2024   After Visit Summary: (MyChart) Due to this being a telephonic visit, the after visit summary with patients personalized plan was offered to patient via MyChart   Notes: Nothing significant to report at this time.

## 2024-03-07 NOTE — Patient Instructions (Addendum)
 Ms. Gossen,  Thank you for taking the time for your Medicare Wellness Visit. I appreciate your continued commitment to your health goals. Please review the care plan we discussed, and feel free to reach out if I can assist you further.  Medicare recommends these wellness visits once per year to help you and your care team stay ahead of potential health issues. These visits are designed to focus on prevention, allowing your provider to concentrate on managing your acute and chronic conditions during your regular appointments.  Please note that Annual Wellness Visits do not include a physical exam. Some assessments may be limited, especially if the visit was conducted virtually. If needed, we may recommend a separate in-person follow-up with your provider.  Ongoing Care Seeing your primary care provider every 3 to 6 months helps us  monitor your health and provide consistent, personalized care.   Referrals If a referral was made during today's visit and you haven't received any updates within two weeks, please contact the referred provider directly to check on the status.  Recommended Screenings:  Health Maintenance  Topic Date Due   Complete foot exam   08/19/2023   Flu Shot  01/13/2024   COVID-19 Vaccine (6 - Pfizer risk 2024-25 season) 02/13/2024   Hemoglobin A1C  02/18/2024   Yearly kidney function blood test for diabetes  08/17/2024   Yearly kidney health urinalysis for diabetes  08/17/2024   Eye exam for diabetics  11/03/2024   Medicare Annual Wellness Visit  03/07/2025   Breast Cancer Screening  06/29/2025   DTaP/Tdap/Td vaccine (2 - Td or Tdap) 10/22/2026   Colon Cancer Screening  08/12/2031   Pneumococcal Vaccine for age over 68  Completed   DEXA scan (bone density measurement)  Completed   Hepatitis C Screening  Completed   HPV Vaccine  Aged Out   Meningitis B Vaccine  Aged Out   Zoster (Shingles) Vaccine  Discontinued       03/07/2024   10:22 AM  Advanced Directives   Does Patient Have a Medical Advance Directive? No  Would patient like information on creating a medical advance directive? Yes (MAU/Ambulatory/Procedural Areas - Information given)   Advance Care Planning is important because it: Ensures you receive medical care that aligns with your values, goals, and preferences. Provides guidance to your family and loved ones, reducing the emotional burden of decision-making during critical moments.  Vision: Annual vision screenings are recommended for early detection of glaucoma, cataracts, and diabetic retinopathy. These exams can also reveal signs of chronic conditions such as diabetes and high blood pressure.  Dental: Annual dental screenings help detect early signs of oral cancer, gum disease, and other conditions linked to overall health, including heart disease and diabetes.

## 2024-03-22 ENCOUNTER — Ambulatory Visit: Admitting: Family Medicine

## 2024-04-05 ENCOUNTER — Ambulatory Visit: Admitting: Family Medicine

## 2024-04-05 ENCOUNTER — Ambulatory Visit (INDEPENDENT_AMBULATORY_CARE_PROVIDER_SITE_OTHER)

## 2024-04-05 ENCOUNTER — Ambulatory Visit: Payer: Self-pay | Admitting: Family Medicine

## 2024-04-05 VITALS — BP 120/76 | HR 47 | Temp 97.6°F | Ht 65.0 in | Wt 208.0 lb

## 2024-04-05 DIAGNOSIS — E1165 Type 2 diabetes mellitus with hyperglycemia: Secondary | ICD-10-CM

## 2024-04-05 DIAGNOSIS — M25642 Stiffness of left hand, not elsewhere classified: Secondary | ICD-10-CM | POA: Diagnosis not present

## 2024-04-05 DIAGNOSIS — M25641 Stiffness of right hand, not elsewhere classified: Secondary | ICD-10-CM

## 2024-04-05 DIAGNOSIS — E1169 Type 2 diabetes mellitus with other specified complication: Secondary | ICD-10-CM

## 2024-04-05 DIAGNOSIS — I10 Essential (primary) hypertension: Secondary | ICD-10-CM

## 2024-04-05 DIAGNOSIS — E785 Hyperlipidemia, unspecified: Secondary | ICD-10-CM | POA: Diagnosis not present

## 2024-04-05 DIAGNOSIS — Z23 Encounter for immunization: Secondary | ICD-10-CM | POA: Diagnosis not present

## 2024-04-05 DIAGNOSIS — M79641 Pain in right hand: Secondary | ICD-10-CM

## 2024-04-05 DIAGNOSIS — Z8261 Family history of arthritis: Secondary | ICD-10-CM

## 2024-04-05 DIAGNOSIS — K760 Fatty (change of) liver, not elsewhere classified: Secondary | ICD-10-CM

## 2024-04-05 LAB — CBC WITH DIFFERENTIAL/PLATELET
Basophils Absolute: 0 K/uL (ref 0.0–0.1)
Basophils Relative: 0.8 % (ref 0.0–3.0)
Eosinophils Absolute: 0.3 K/uL (ref 0.0–0.7)
Eosinophils Relative: 6.2 % — ABNORMAL HIGH (ref 0.0–5.0)
HCT: 40.7 % (ref 36.0–46.0)
Hemoglobin: 13.6 g/dL (ref 12.0–15.0)
Lymphocytes Relative: 41.1 % (ref 12.0–46.0)
Lymphs Abs: 2.2 K/uL (ref 0.7–4.0)
MCHC: 33.4 g/dL (ref 30.0–36.0)
MCV: 88.7 fl (ref 78.0–100.0)
Monocytes Absolute: 0.4 K/uL (ref 0.1–1.0)
Monocytes Relative: 7.9 % (ref 3.0–12.0)
Neutro Abs: 2.4 K/uL (ref 1.4–7.7)
Neutrophils Relative %: 44 % (ref 43.0–77.0)
Platelets: 264 K/uL (ref 150.0–400.0)
RBC: 4.59 Mil/uL (ref 3.87–5.11)
RDW: 12.8 % (ref 11.5–15.5)
WBC: 5.4 K/uL (ref 4.0–10.5)

## 2024-04-05 LAB — LIPID PANEL
Cholesterol: 147 mg/dL (ref 0–200)
HDL: 55.1 mg/dL (ref >39.00)
LDL Cholesterol: 74 mg/dL (ref 0–99)
NonHDL: 92.14
Total CHOL/HDL Ratio: 3
Triglycerides: 91 mg/dL (ref 0.0–149.0)
VLDL: 18.2 mg/dL (ref 0.0–40.0)

## 2024-04-05 LAB — TSH: TSH: 3.02 u[IU]/mL (ref 0.35–5.50)

## 2024-04-05 LAB — COMPREHENSIVE METABOLIC PANEL WITH GFR
ALT: 33 U/L (ref 0–35)
AST: 23 U/L (ref 0–37)
Albumin: 4.5 g/dL (ref 3.5–5.2)
Alkaline Phosphatase: 53 U/L (ref 39–117)
BUN: 14 mg/dL (ref 6–23)
CO2: 29 meq/L (ref 19–32)
Calcium: 9.8 mg/dL (ref 8.4–10.5)
Chloride: 101 meq/L (ref 96–112)
Creatinine, Ser: 0.83 mg/dL (ref 0.40–1.20)
GFR: 73.25 mL/min (ref >60.00)
Glucose, Bld: 94 mg/dL (ref 70–99)
Potassium: 3.7 meq/L (ref 3.5–5.1)
Sodium: 138 meq/L (ref 135–145)
Total Bilirubin: 0.4 mg/dL (ref 0.2–1.2)
Total Protein: 7.5 g/dL (ref 6.0–8.3)

## 2024-04-05 LAB — SEDIMENTATION RATE: Sed Rate: 11 mm/h (ref 0–30)

## 2024-04-05 LAB — HEMOGLOBIN A1C: Hgb A1c MFr Bld: 6.5 % (ref 4.6–6.5)

## 2024-04-05 LAB — C-REACTIVE PROTEIN: CRP: <0.5 mg/dL (ref 0.5–20.0)

## 2024-04-05 NOTE — Progress Notes (Signed)
 Subjective:     Patient ID: Brittany Mooney, female    DOB: 05-02-1956, 68 y.o.   MRN: [id number]  Chief Complaint  Patient presents with   Medical Management of Chronic Issues    Right hand pain, wondering if may be arthritis     HPI  Discussed the use of AI scribe software for clinical note transcription with the patient, who gave verbal consent to proceed.  History of Present Illness Brittany Mooney is a 68 year old female with diabetes, hyperlipidemia, and hypertension who presents for follow-up on chronic health conditions.  Glycemic control - Diabetes managed with a low-carbohydrate diet - Last hemoglobin A1c was 6.5% in March - Morning blood glucose levels range from 120-125 mg/dL - Recent postprandial blood glucose was 94 mg/dL - Stable weight without weight loss  Hyperlipidemia management - Cardiology switched her from atorvastatin  to generic rosuvastatin  (Crestor ) two months ago. No repeat lipid panel   Hepatic steatosis - CT scan in July revealed fatty liver disease  Hand arthralgia and stiffness - Stiffness and sharp pain in both hands, more pronounced in the right hand - Symptoms are worse in the morning and with gripping objects - Occasional nocturnal numbness in the hands - No regular use of NSAIDs - Maternal history of rheumatoid arthritis  Functional status and activity tolerance - Remains active at home, performing activities such as raking leaves and washing house siding - No ankle swelling, dizziness, or other symptoms     Health Maintenance Due  Topic Date Due   HEMOGLOBIN A1C  02/18/2024    Past Medical History:  Diagnosis Date   Diabetes mellitus without complication (HCC)    controlling with diet   Elevated ALT measurement    Hyperlipidemia    Hypertension    Prediabetes     Past Surgical History:  Procedure Laterality Date   CATARACT EXTRACTION  2021   INDUCED ABORTION     PARTIAL HYSTERECTOMY     TUBAL LIGATION       Family History  Problem Relation Age of Onset   Ovarian cancer Mother 60   Breast cancer Mother 65 - 88   Hypertension Mother    Rheum arthritis Mother    Thyroid disease Mother    Vaginal cancer Mother 77       vulvar   Colon cancer Mother 54   Lung cancer Father 67   Colon polyps Brother    Heart disease Brother    Colitis Brother    COPD Brother    Diabetes Brother    Breast cancer Maternal Aunt 60   Breast cancer Maternal Aunt 79   Cancer Paternal Aunt    Alcoholism Daughter    Hepatitis C Daughter    Alcoholism Son    Breast cancer Cousin 32   Breast cancer Cousin 7 - 9   Melanoma Cousin 70   Melanoma Cousin 50 - 59   Penile cancer Cousin    Esophageal cancer Neg Hx    Rectal cancer Neg Hx    Stomach cancer Neg Hx     Social History   Socioeconomic History   Marital status: Married    Spouse name: Not on file   Number of children: Not on file   Years of education: Not on file   Highest education level: Some college, no degree  Occupational History   Not on file  Tobacco Use   Smoking status: Never   Smokeless tobacco: Never  Vaping Use  Vaping status: Never Used  Substance and Sexual Activity   Alcohol use: Yes    Alcohol/week: 1.0 standard drink of alcohol    Types: 1 Standard drinks or equivalent per week    Comment: occassional   Drug use: No   Sexual activity: Yes  Other Topics Concern   Not on file  Social History Narrative   Married   Social Drivers of Health   Financial Resource Strain: Low Risk  (04/05/2024)   Overall Financial Resource Strain (CARDIA)    Difficulty of Paying Living Expenses: Not very hard  Food Insecurity: No Food Insecurity (04/05/2024)   Hunger Vital Sign    Worried About Running Out of Food in the Last Year: Never true    Ran Out of Food in the Last Year: Never true  Transportation Needs: No Transportation Needs (04/05/2024)   PRAPARE - Administrator, Civil Service (Medical): No    Lack of  Transportation (Non-Medical): No  Physical Activity: Unknown (04/05/2024)   Exercise Vital Sign    Days of Exercise per Week: 2 days    Minutes of Exercise per Session: Patient declined  Recent Concern: Physical Activity - Inactive (03/07/2024)   Exercise Vital Sign    Days of Exercise per Week: 0 days    Minutes of Exercise per Session: 0 min  Stress: Stress Concern Present (04/05/2024)   [employer] of Occupational Health - Occupational Stress Questionnaire    Feeling of Stress: To some extent  Social Connections: Moderately Isolated (04/05/2024)   Social Connection and Isolation Panel    Frequency of Communication with Friends and Family: More than three times a week    Frequency of Social Gatherings with Friends and Family: Once a week    Attends Religious Services: Patient declined    Database administrator or Organizations: No    Attends Engineer, structural: Not on file    Marital Status: Married  Catering manager Violence: Not At Risk (03/07/2024)   Humiliation, Afraid, Rape, and Kick questionnaire    Fear of Current or Ex-Partner: No    Emotionally Abused: No    Physically Abused: No    Sexually Abused: No    Outpatient Medications Prior to Visit  Medication Sig Dispense Refill   aspirin  EC 81 MG tablet Take 1 tablet (81 mg total) by mouth daily. Swallow whole.     atenolol  (TENORMIN ) 50 MG tablet TAKE 1 TABLET DAILY 90 tablet 1   diphenhydrAMINE (BENADRYL) 25 MG tablet Take 25 mg by mouth every 6 (six) hours as needed.     hydrochlorothiazide  (HYDRODIURIL ) 25 MG tablet TAKE 1 TABLET DAILY 90 tablet 1   losartan  (COZAAR ) 25 MG tablet TAKE 1 TABLET DAILY 90 tablet 1   rosuvastatin  (CRESTOR ) 20 MG tablet Take 1 tablet (20 mg total) by mouth daily. 90 tablet 3   Blood Glucose Monitoring Suppl DEVI May substitute to any manufacturer covered by patient's insurance. 1 each 0   Glucose Blood (BLOOD GLUCOSE TEST STRIPS) STRP May substitute to any manufacturer  covered by patient's insurance. Use to check blood sugars 2 times daily. 200 strip 6   Lancet Device MISC May substitute to any manufacturer covered by patient's insurance. 1 each 0   Lancets Misc. MISC May substitute to any manufacturer covered by patient's insurance. Use to check blood sugars 2 times daily. 200 each 6   Facility-Administered Medications Prior to Visit  Medication Dose Route Frequency Provider Last Rate Last Admin  0.9 %  sodium chloride  infusion  500 mL Intravenous Continuous Eda Iha, MD        No Known Allergies  ROS Per HPI    Objective:    Physical Exam Constitutional:      General: She is not in acute distress.    Appearance: She is not ill-appearing.  HENT:     Mouth/Throat:     Mouth: Mucous membranes are moist.     Pharynx: Oropharynx is clear.  Eyes:     Extraocular Movements: Extraocular movements intact.     Conjunctiva/sclera: Conjunctivae normal.  Cardiovascular:     Rate and Rhythm: Normal rate and regular rhythm.  Pulmonary:     Effort: Pulmonary effort is normal.     Breath sounds: Normal breath sounds.  Musculoskeletal:     Right forearm: Normal.     Left forearm: Normal.     Right wrist: Snuff box tenderness present. No swelling or crepitus. Normal range of motion. Normal pulse.     Right hand: No swelling. Normal range of motion. Normal strength. Normal sensation. Normal capillary refill. Normal pulse.     Left hand: No swelling. Normal range of motion. Normal strength. Normal sensation. Normal capillary refill. Normal pulse.     Cervical back: Normal range of motion and neck supple.     Right lower leg: No edema.     Left lower leg: No edema.  Skin:    General: Skin is warm and dry.  Neurological:     General: No focal deficit present.     Mental Status: She is alert and oriented to person, place, and time.     Motor: No weakness.     Coordination: Coordination normal.     Gait: Gait normal.  Psychiatric:        Mood  and Affect: Mood normal.        Behavior: Behavior normal.        Thought Content: Thought content normal.      BP 120/76   Pulse (!) 47   Temp 97.6 F (36.4 C) (Temporal)   Ht 5' 5 (1.651 m)   Wt 208 lb (94.3 kg)   SpO2 99%   BMI 34.61 kg/m  Wt Readings from Last 3 Encounters:  04/05/24 208 lb (94.3 kg)  03/07/24 208 lb (94.3 kg)  11/14/23 213 lb (96.6 kg)       Assessment & Plan:   Problem List Items Addressed This Visit     Essential hypertension   Relevant Orders   CBC with Differential/Platelet   Comprehensive metabolic panel with GFR   TSH   Hyperlipidemia associated with type 2 diabetes mellitus (HCC)   Relevant Orders   Lipid panel   Type 2 diabetes mellitus with hyperglycemia, without long-term current use of insulin (HCC) - Primary   Relevant Orders   CBC with Differential/Platelet   Comprehensive metabolic panel with GFR   Hemoglobin A1c   TSH   Other Visit Diagnoses       Need for influenza vaccination       Relevant Orders   Flu vaccine HIGH DOSE PF(Fluzone Trivalent) (Completed)     Fatty liver         Morning joint stiffness of right hand       Relevant Orders   DG Hand Complete Right   Sedimentation rate   Rheumatoid factor   C-reactive protein   ANA     Morning joint stiffness of left hand  Relevant Orders   Sedimentation rate   Rheumatoid factor   C-reactive protein   ANA     Pain of right hand       Relevant Orders   DG Hand Complete Right   Sedimentation rate   Rheumatoid factor   C-reactive protein     Family history of rheumatoid arthritis       Relevant Orders   Rheumatoid factor       Assessment and Plan Assessment & Plan Type 2 diabetes mellitus without complications Type 2 diabetes mellitus is managed without medication. Blood glucose levels are monitored daily, with morning readings around 120-125 mg/dL and a recent postprandial reading of 94 mg/dL. The last A1c was 6.5% in March, indicating prediabetes. She  is not interested in initiating GLP-1 receptor agonists at this time. - Encourage dietary modifications to reduce carbohydrate intake - Discuss potential future use of GLP-1 receptor agonists if interested  Essential hypertension Hypertension is managed with atenolol , hydrochlorothiazide , and losartan . Pulse is 47 bpm which is at baseline. She is asymptomatic and on a beta blocker. - Continue current antihypertensive medications - Monitor pulse and blood pressure regularly  Hyperlipidemia Hyperlipidemia is managed with rosuvastatin . The goal is to achieve an LDL level of less than 70 mg/dL. She was unaware of this goal. - Check lipid panel to assess current cholesterol levels  Nonalcoholic fatty liver disease Nonalcoholic fatty liver disease was identified on a CT scan. Manage with lifestyle modifications, including a low-fat diet and avoiding alcohol. She does not consume alcohol regularly. GLP-1 receptor agonists are beneficial but she is resistant to starting them. - Monitor liver function tests periodically - Encourage lifestyle modifications to improve liver health  Right hand pain and stiffness, possible osteoarthritis or autoimmune etiology Reports stiffness and sharp pain in the right hand, particularly in the morning, with tenderness in the joint. Differential diagnosis includes osteoarthritis or an autoimmune condition such as rheumatoid arthritis, especially given the family history of rheumatoid arthritis. - Order x-ray of the right hand - Prescribe Voltaren gel for topical use up to four times a day - Consider NSAIDs like ibuprofen or Aleve for pain management if needed - Order blood tests including ANA to check for autoimmune issues or rheumatoid arthritis - Consider referral to a hand specialist based on x-ray and lab results  Obesity (BMI 30-39.9) She is maintaining her current weight but not actively losing weight. She is not engaging in regular physical activity during  the week but remains active on weekends with household chores. - Encourage increased physical activity during the week - Declines GLP-1     I have discontinued Brittany DEL. Kaus's Blood Glucose Monitoring Suppl, BLOOD GLUCOSE TEST STRIPS, Lancet Device, and Lancets Misc.. I am also having her maintain her diphenhydrAMINE, aspirin  EC, rosuvastatin , hydrochlorothiazide , losartan , and atenolol . We will continue to administer sodium chloride .  No orders of the defined types were placed in this encounter.

## 2024-04-05 NOTE — Patient Instructions (Signed)
 Please go downstairs for labs before you leave  Continue monitoring your blood sugars, blood pressure and pulse at home  I will be in touch with your results

## 2024-04-07 LAB — ANA: Anti Nuclear Antibody (ANA): NEGATIVE

## 2024-04-07 LAB — RHEUMATOID FACTOR: Rheumatoid fact SerPl-aCnc: <10 [IU]/mL (ref <14)

## 2024-05-22 ENCOUNTER — Other Ambulatory Visit: Payer: Self-pay | Admitting: Family Medicine

## 2024-05-22 DIAGNOSIS — Z1231 Encounter for screening mammogram for malignant neoplasm of breast: Secondary | ICD-10-CM

## 2024-07-05 ENCOUNTER — Ambulatory Visit

## 2024-07-13 ENCOUNTER — Encounter: Payer: Self-pay | Admitting: Gastroenterology

## 2024-07-19 ENCOUNTER — Ambulatory Visit
Admission: RE | Admit: 2024-07-19 | Discharge: 2024-07-19 | Disposition: A | Source: Ambulatory Visit | Attending: Family Medicine | Admitting: Family Medicine

## 2024-07-19 DIAGNOSIS — Z1231 Encounter for screening mammogram for malignant neoplasm of breast: Secondary | ICD-10-CM

## 2024-08-09 ENCOUNTER — Ambulatory Visit: Admitting: Family Medicine

## 2025-03-08 ENCOUNTER — Ambulatory Visit
